# Patient Record
Sex: Female | Born: 1935 | Race: White | Hispanic: No | Marital: Single
Health system: Midwestern US, Academic
[De-identification: ages and names within clinical notes are randomized; demographics above are authoritative.]

---

## 2017-12-29 ENCOUNTER — Encounter: Admit: 2017-12-29 | Discharge: 2017-12-29 | Payer: MEDICARE

## 2017-12-29 DIAGNOSIS — R32 Unspecified urinary incontinence: ICD-10-CM

## 2017-12-29 DIAGNOSIS — R351 Nocturia: Principal | ICD-10-CM

## 2017-12-29 DIAGNOSIS — C50919 Malignant neoplasm of unspecified site of unspecified female breast: Principal | ICD-10-CM

## 2017-12-29 DIAGNOSIS — F329 Major depressive disorder, single episode, unspecified: ICD-10-CM

## 2017-12-29 DIAGNOSIS — R350 Frequency of micturition: ICD-10-CM

## 2017-12-29 DIAGNOSIS — E785 Hyperlipidemia, unspecified: ICD-10-CM

## 2017-12-29 DIAGNOSIS — N3941 Urge incontinence: ICD-10-CM

## 2017-12-29 DIAGNOSIS — K5792 Diverticulitis of intestine, part unspecified, without perforation or abscess without bleeding: ICD-10-CM

## 2017-12-29 DIAGNOSIS — N393 Stress incontinence (female) (male): ICD-10-CM

## 2017-12-29 DIAGNOSIS — N390 Urinary tract infection, site not specified: ICD-10-CM

## 2018-01-05 ENCOUNTER — Encounter: Admit: 2018-01-05 | Discharge: 2018-01-05 | Payer: MEDICARE

## 2018-01-05 DIAGNOSIS — N898 Other specified noninflammatory disorders of vagina: Principal | ICD-10-CM

## 2018-02-02 ENCOUNTER — Encounter: Admit: 2018-02-02 | Discharge: 2018-02-02 | Payer: MEDICARE

## 2018-02-02 ENCOUNTER — Ambulatory Visit: Admit: 2018-02-02 | Discharge: 2018-02-03 | Payer: MEDICARE

## 2018-02-02 DIAGNOSIS — R32 Unspecified urinary incontinence: ICD-10-CM

## 2018-02-02 DIAGNOSIS — N898 Other specified noninflammatory disorders of vagina: ICD-10-CM

## 2018-02-02 DIAGNOSIS — N952 Postmenopausal atrophic vaginitis: ICD-10-CM

## 2018-02-02 DIAGNOSIS — C50919 Malignant neoplasm of unspecified site of unspecified female breast: Principal | ICD-10-CM

## 2018-02-02 DIAGNOSIS — F329 Major depressive disorder, single episode, unspecified: ICD-10-CM

## 2018-02-02 DIAGNOSIS — N3946 Mixed incontinence: Principal | ICD-10-CM

## 2018-02-02 DIAGNOSIS — E785 Hyperlipidemia, unspecified: ICD-10-CM

## 2018-02-02 DIAGNOSIS — K5792 Diverticulitis of intestine, part unspecified, without perforation or abscess without bleeding: ICD-10-CM

## 2018-02-02 DIAGNOSIS — N390 Urinary tract infection, site not specified: ICD-10-CM

## 2018-02-02 MED ORDER — ESTRADIOL 0.01 % (0.1 MG/GRAM) VA CREA
VAGINAL | 1 refills | 30.00000 days | Status: AC
Start: 2018-02-02 — End: 2020-02-25

## 2018-02-02 MED ORDER — SODIUM CHLORIDE 0.9 % IJ SOLN
50 mL | Freq: Once | INTRAVENOUS | 0 refills | Status: AC
Start: 2018-02-02 — End: ?

## 2018-02-02 MED ORDER — GADOBENATE DIMEGLUMINE 529 MG/ML (0.1MMOL/0.2ML) IV SOLN
14 mL | Freq: Once | INTRAVENOUS | 0 refills | Status: CP
Start: 2018-02-02 — End: ?
  Administered 2018-02-02: 14:00:00 14 mL via INTRAVENOUS

## 2018-02-02 MED ORDER — GENTAMICIN 40 MG/ML IJ SOLN
80 mg | Freq: Once | INTRAMUSCULAR | 0 refills | Status: CP
Start: 2018-02-02 — End: ?
  Administered 2018-02-02: 16:00:00 80 mg via INTRAMUSCULAR

## 2018-02-02 MED ADMIN — WATER FOR INJECTION, STERILE IV SOLP [28400]: BODY_CAVITY | @ 16:00:00 | Stop: 2018-02-02 | NDC 00264785000

## 2018-02-02 MED ADMIN — LIDOCAINE HCL 2 % MM JELP [170085]: BODY_CAVITY | @ 16:00:00 | Stop: 2018-02-02 | NDC 76329301505

## 2018-02-05 ENCOUNTER — Encounter: Admit: 2018-02-05 | Discharge: 2018-02-05 | Payer: MEDICARE

## 2018-02-05 DIAGNOSIS — N898 Other specified noninflammatory disorders of vagina: Principal | ICD-10-CM

## 2018-05-04 ENCOUNTER — Encounter: Admit: 2018-05-04 | Discharge: 2018-05-04 | Payer: MEDICARE

## 2018-05-04 DIAGNOSIS — K5792 Diverticulitis of intestine, part unspecified, without perforation or abscess without bleeding: ICD-10-CM

## 2018-05-04 DIAGNOSIS — F329 Major depressive disorder, single episode, unspecified: ICD-10-CM

## 2018-05-04 DIAGNOSIS — R32 Unspecified urinary incontinence: ICD-10-CM

## 2018-05-04 DIAGNOSIS — N393 Stress incontinence (female) (male): ICD-10-CM

## 2018-05-04 DIAGNOSIS — R350 Frequency of micturition: Principal | ICD-10-CM

## 2018-05-04 DIAGNOSIS — N390 Urinary tract infection, site not specified: ICD-10-CM

## 2018-05-04 DIAGNOSIS — C50919 Malignant neoplasm of unspecified site of unspecified female breast: Principal | ICD-10-CM

## 2018-05-04 DIAGNOSIS — E785 Hyperlipidemia, unspecified: ICD-10-CM

## 2018-06-26 ENCOUNTER — Encounter: Admit: 2018-06-26 | Discharge: 2018-06-26 | Payer: MEDICARE

## 2018-06-26 DIAGNOSIS — N393 Stress incontinence (female) (male): Principal | ICD-10-CM

## 2018-06-26 DIAGNOSIS — R32 Unspecified urinary incontinence: Principal | ICD-10-CM

## 2018-07-04 DIAGNOSIS — N393 Stress incontinence (female) (male): ICD-10-CM

## 2018-07-06 ENCOUNTER — Ambulatory Visit: Admit: 2018-07-06 | Discharge: 2018-07-06 | Payer: MEDICARE

## 2018-07-06 ENCOUNTER — Encounter: Admit: 2018-07-06 | Discharge: 2018-07-06 | Payer: MEDICARE

## 2018-07-06 DIAGNOSIS — F329 Major depressive disorder, single episode, unspecified: ICD-10-CM

## 2018-07-06 DIAGNOSIS — N390 Urinary tract infection, site not specified: ICD-10-CM

## 2018-07-06 DIAGNOSIS — R32 Unspecified urinary incontinence: ICD-10-CM

## 2018-07-06 DIAGNOSIS — K5792 Diverticulitis of intestine, part unspecified, without perforation or abscess without bleeding: ICD-10-CM

## 2018-07-06 DIAGNOSIS — E785 Hyperlipidemia, unspecified: ICD-10-CM

## 2018-07-06 DIAGNOSIS — C50919 Malignant neoplasm of unspecified site of unspecified female breast: Principal | ICD-10-CM

## 2018-07-08 LAB — CULTURE-URINE W/SENSITIVITY
Lab: >10
Lab: >10 — AB

## 2018-07-09 ENCOUNTER — Encounter: Admit: 2018-07-09 | Discharge: 2018-07-09 | Payer: MEDICARE

## 2018-07-09 MED ORDER — SULFAMETHOXAZOLE-TRIMETHOPRIM 800-160 MG PO TAB
1 | ORAL_TABLET | Freq: Two times a day (BID) | ORAL | 0 refills | Status: AC
Start: 2018-07-09 — End: ?

## 2018-07-15 ENCOUNTER — Ambulatory Visit: Admit: 2018-07-15 | Discharge: 2018-07-15 | Payer: MEDICARE

## 2018-07-16 ENCOUNTER — Encounter: Admit: 2018-07-16 | Discharge: 2018-07-16 | Payer: MEDICARE

## 2018-07-16 ENCOUNTER — Ambulatory Visit: Admit: 2018-07-16 | Discharge: 2018-07-16 | Payer: MEDICARE

## 2018-07-16 DIAGNOSIS — K5792 Diverticulitis of intestine, part unspecified, without perforation or abscess without bleeding: ICD-10-CM

## 2018-07-16 DIAGNOSIS — Z881 Allergy status to other antibiotic agents status: ICD-10-CM

## 2018-07-16 DIAGNOSIS — R32 Unspecified urinary incontinence: ICD-10-CM

## 2018-07-16 DIAGNOSIS — Z7982 Long term (current) use of aspirin: ICD-10-CM

## 2018-07-16 DIAGNOSIS — Z833 Family history of diabetes mellitus: ICD-10-CM

## 2018-07-16 DIAGNOSIS — F329 Major depressive disorder, single episode, unspecified: ICD-10-CM

## 2018-07-16 DIAGNOSIS — N3946 Mixed incontinence: Principal | ICD-10-CM

## 2018-07-16 DIAGNOSIS — Z809 Family history of malignant neoplasm, unspecified: ICD-10-CM

## 2018-07-16 DIAGNOSIS — Z8249 Family history of ischemic heart disease and other diseases of the circulatory system: ICD-10-CM

## 2018-07-16 DIAGNOSIS — N390 Urinary tract infection, site not specified: ICD-10-CM

## 2018-07-16 DIAGNOSIS — E785 Hyperlipidemia, unspecified: ICD-10-CM

## 2018-07-16 DIAGNOSIS — Z87440 Personal history of urinary (tract) infections: ICD-10-CM

## 2018-07-16 DIAGNOSIS — Z853 Personal history of malignant neoplasm of breast: ICD-10-CM

## 2018-07-16 DIAGNOSIS — Z79899 Other long term (current) drug therapy: ICD-10-CM

## 2018-07-16 DIAGNOSIS — C50919 Malignant neoplasm of unspecified site of unspecified female breast: Principal | ICD-10-CM

## 2018-07-16 DIAGNOSIS — Z880 Allergy status to penicillin: ICD-10-CM

## 2018-07-16 LAB — BASIC METABOLIC PANEL
Lab: 1.4 mg/dL — ABNORMAL HIGH (ref 0.4–1.00)
Lab: 103 MMOL/L (ref 98–110)
Lab: 11 (ref 3–12)
Lab: 137 MMOL/L (ref 137–147)
Lab: 22 mg/dL (ref 7–25)
Lab: 23 MMOL/L (ref 21–30)
Lab: 3.9 MMOL/L (ref 3.5–5.1)
Lab: 34 mL/min — ABNORMAL LOW (ref >60)
Lab: 42 mL/min — ABNORMAL LOW (ref >60)
Lab: 9.5 mg/dL (ref 8.5–10.6)
Lab: 98 mg/dL (ref 70–100)

## 2018-07-16 MED ORDER — HYDROMORPHONE (PF) 2 MG/ML IJ SYRG
.25 mg | INTRAVENOUS | 0 refills | Status: DC | PRN
Start: 2018-07-16 — End: 2018-07-16

## 2018-07-16 MED ORDER — FENTANYL CITRATE (PF) 50 MCG/ML IJ SOLN
50 ug | INTRAVENOUS | 0 refills | Status: DC | PRN
Start: 2018-07-16 — End: 2018-07-16
  Administered 2018-07-16: 20:00:00 25 ug via INTRAVENOUS

## 2018-07-16 MED ORDER — SODIUM CHLORIDE 0.9 % IV SOLP
INTRAVENOUS | 0 refills | Status: DC
Start: 2018-07-16 — End: 2018-07-16
  Administered 2018-07-16: 18:00:00 1000.000 mL via INTRAVENOUS

## 2018-07-16 MED ORDER — GENTAMICIN IVPB (EXTENDED INTERVAL)
330 mg | Freq: Once | INTRAVENOUS | 0 refills | Status: CP
Start: 2018-07-16 — End: ?
  Administered 2018-07-16 (×2): 330 mg via INTRAVENOUS

## 2018-07-16 MED ORDER — DEXAMETHASONE SODIUM PHOSPHATE 4 MG/ML IJ SOLN
INTRAVENOUS | 0 refills | Status: DC
Start: 2018-07-16 — End: 2018-07-16
  Administered 2018-07-16: 19:00:00 4 mg via INTRAVENOUS

## 2018-07-16 MED ORDER — SODIUM CHLORIDE 0.9% IRRIGATION BAG
0 refills | Status: DC
Start: 2018-07-16 — End: 2018-07-16
  Administered 2018-07-16: 19:00:00 3000 mL

## 2018-07-16 MED ORDER — PROMETHAZINE 25 MG/ML IJ SOLN
6.25 mg | INTRAVENOUS | 0 refills | Status: DC | PRN
Start: 2018-07-16 — End: 2018-07-16

## 2018-07-16 MED ORDER — PROPOFOL INJ 10 MG/ML IV VIAL
0 refills | Status: DC
Start: 2018-07-16 — End: 2018-07-16
  Administered 2018-07-16: 19:00:00 80 mg via INTRAVENOUS
  Administered 2018-07-16: 19:00:00 20 mg via INTRAVENOUS

## 2018-07-16 MED ORDER — DEXTRAN 70-HYPROMELLOSE (PF) 0.1-0.3 % OP DPET
0 refills | Status: DC
Start: 2018-07-16 — End: 2018-07-16
  Administered 2018-07-16: 19:00:00 2 [drp] via OPHTHALMIC

## 2018-07-16 MED ORDER — ONDANSETRON HCL (PF) 4 MG/2 ML IJ SOLN
INTRAVENOUS | 0 refills | Status: DC
Start: 2018-07-16 — End: 2018-07-16
  Administered 2018-07-16: 19:00:00 4 mg via INTRAVENOUS

## 2018-07-16 MED ORDER — LACTATED RINGERS IV SOLP
INTRAVENOUS | 0 refills | Status: DC
Start: 2018-07-16 — End: 2018-07-16

## 2018-07-16 MED ORDER — LIDOCAINE (PF) 200 MG/10 ML (2 %) IJ SYRG
0 refills | Status: DC
Start: 2018-07-16 — End: 2018-07-16
  Administered 2018-07-16: 19:00:00 60 mg via INTRAVENOUS

## 2018-07-16 MED ORDER — EPHEDRINE SULFATE 50 MG/5ML SYR (10 MG/ML) (AN)(OSM)
0 refills | Status: DC
Start: 2018-07-16 — End: 2018-07-16
  Administered 2018-07-16 (×2): 5 mg via INTRAVENOUS

## 2018-07-16 MED ORDER — HALOPERIDOL LACTATE 5 MG/ML IJ SOLN
1 mg | Freq: Once | INTRAVENOUS | 0 refills | Status: DC | PRN
Start: 2018-07-16 — End: 2018-07-16

## 2018-07-16 MED ORDER — FENTANYL CITRATE (PF) 50 MCG/ML IJ SOLN
0 refills | Status: DC
Start: 2018-07-16 — End: 2018-07-16
  Administered 2018-07-16: 19:00:00 25 ug via INTRAVENOUS

## 2018-07-16 MED ORDER — MEPERIDINE (PF) 25 MG/ML IJ SYRG
12.5 mg | INTRAVENOUS | 0 refills | Status: DC | PRN
Start: 2018-07-16 — End: 2018-07-16

## 2018-07-16 MED ORDER — LIDOCAINE (PF) 10 MG/ML (1 %) IJ SOLN
.1-2 mL | INTRAMUSCULAR | 0 refills | Status: DC | PRN
Start: 2018-07-16 — End: 2018-07-16

## 2018-07-16 MED ORDER — OXYCODONE 5 MG PO TAB
5 mg | Freq: Once | ORAL | 0 refills | Status: CP | PRN
Start: 2018-07-16 — End: ?
  Administered 2018-07-16: 20:00:00 5 mg via ORAL

## 2018-07-16 MED ORDER — ACETAMINOPHEN 650 MG PO TBER
650 mg | ORAL_TABLET | ORAL | 0 refills | Status: AC | PRN
Start: 2018-07-16 — End: ?

## 2018-07-18 ENCOUNTER — Encounter: Admit: 2018-07-18 | Discharge: 2018-07-18 | Payer: MEDICARE

## 2018-07-18 DIAGNOSIS — E785 Hyperlipidemia, unspecified: ICD-10-CM

## 2018-07-18 DIAGNOSIS — R32 Unspecified urinary incontinence: ICD-10-CM

## 2018-07-18 DIAGNOSIS — F329 Major depressive disorder, single episode, unspecified: ICD-10-CM

## 2018-07-18 DIAGNOSIS — C50919 Malignant neoplasm of unspecified site of unspecified female breast: Principal | ICD-10-CM

## 2018-07-18 DIAGNOSIS — N390 Urinary tract infection, site not specified: ICD-10-CM

## 2018-07-18 DIAGNOSIS — K5792 Diverticulitis of intestine, part unspecified, without perforation or abscess without bleeding: ICD-10-CM

## 2018-09-14 ENCOUNTER — Encounter: Admit: 2018-09-14 | Discharge: 2018-09-14 | Payer: MEDICARE

## 2018-09-14 DIAGNOSIS — N390 Urinary tract infection, site not specified: ICD-10-CM

## 2018-09-14 DIAGNOSIS — F329 Major depressive disorder, single episode, unspecified: ICD-10-CM

## 2018-09-14 DIAGNOSIS — R32 Unspecified urinary incontinence: ICD-10-CM

## 2018-09-14 DIAGNOSIS — E785 Hyperlipidemia, unspecified: ICD-10-CM

## 2018-09-14 DIAGNOSIS — K5792 Diverticulitis of intestine, part unspecified, without perforation or abscess without bleeding: ICD-10-CM

## 2018-09-14 DIAGNOSIS — N3946 Mixed incontinence: Principal | ICD-10-CM

## 2018-09-14 DIAGNOSIS — C50919 Malignant neoplasm of unspecified site of unspecified female breast: Principal | ICD-10-CM

## 2018-10-06 IMAGING — US ECHOCOMPL
1 series · 14 of 24 positions shown · non-contrast
Comparison: none

[Series 1: us echo 2d, wo/w m-mode, compl · 124 acquisitions, 14 frames shown]
[im 1/124]
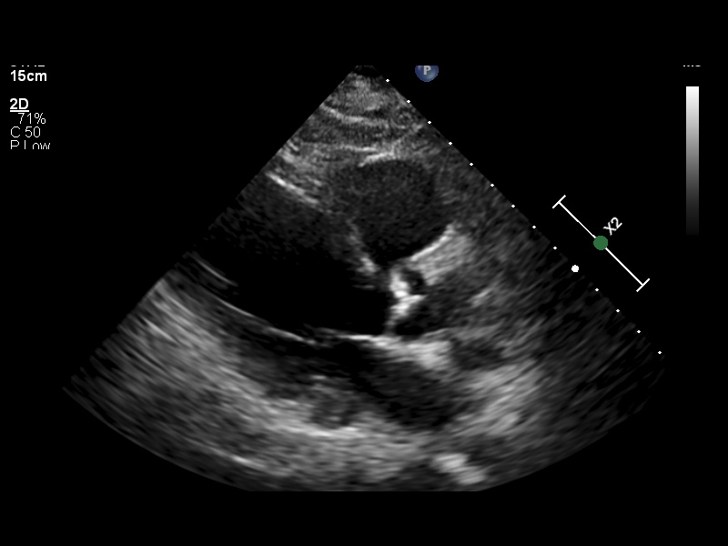
[im 11/124]
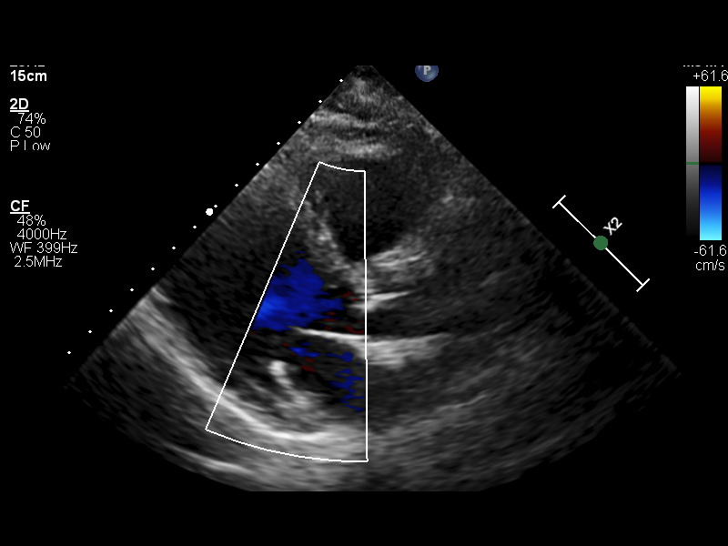
[im 17/124]
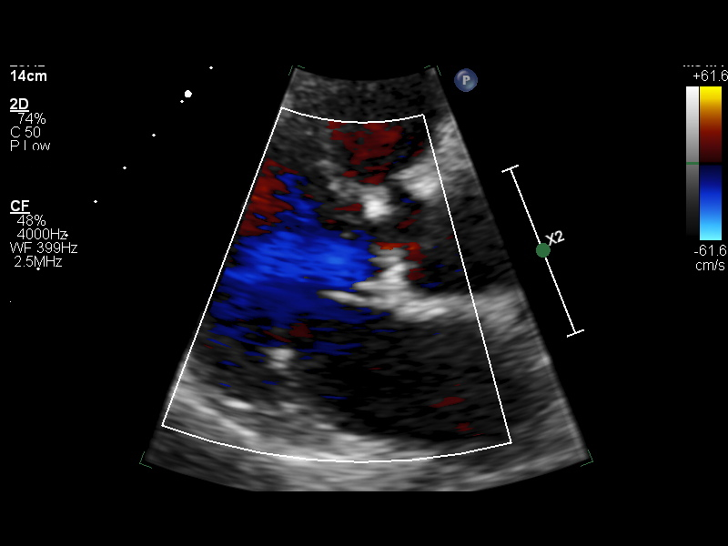
[im 33/124]
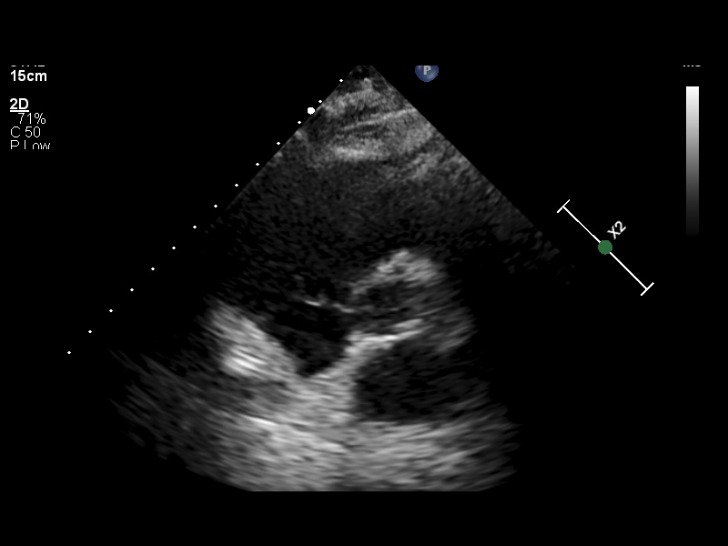
[im 33/124]
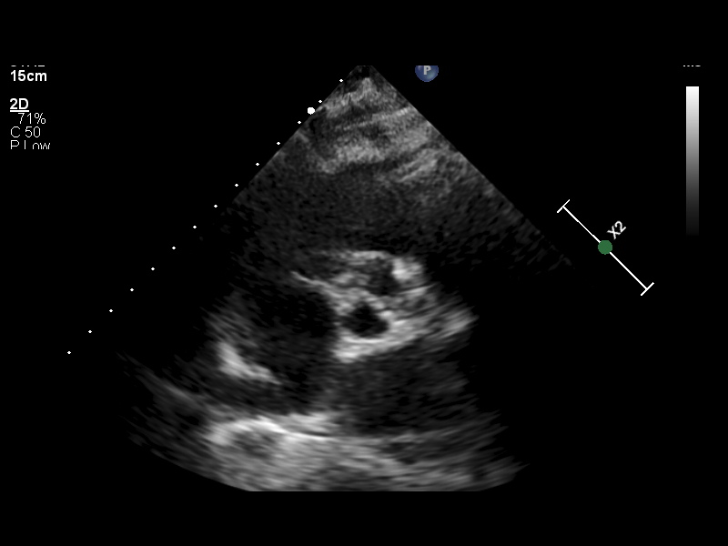
[im 43/124]
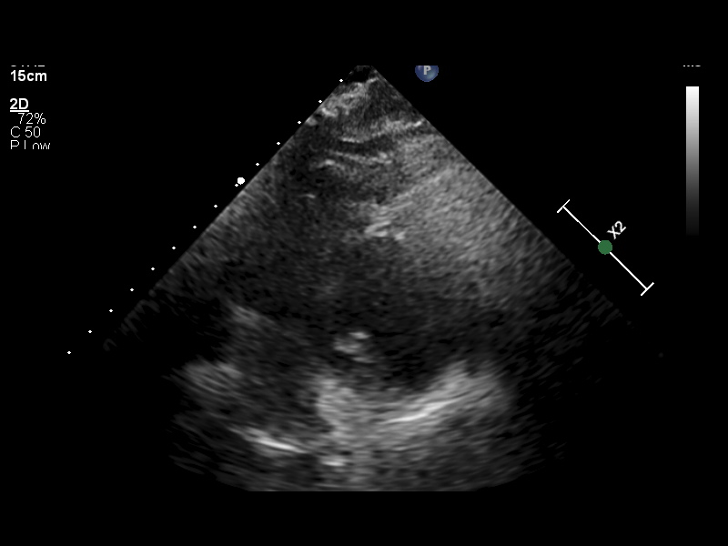
[im 59/124]
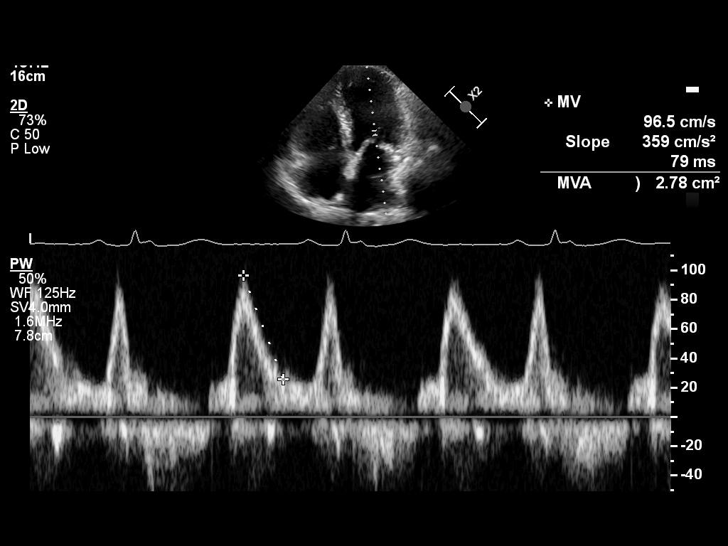
[im 65/124]
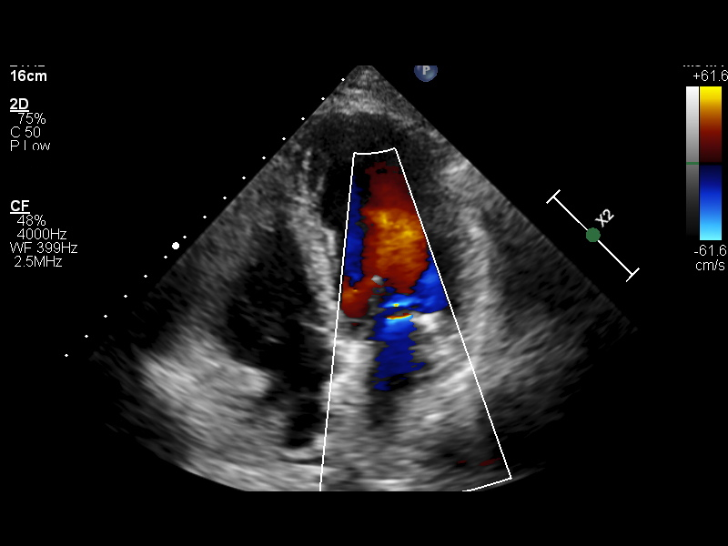
[im 75/124]
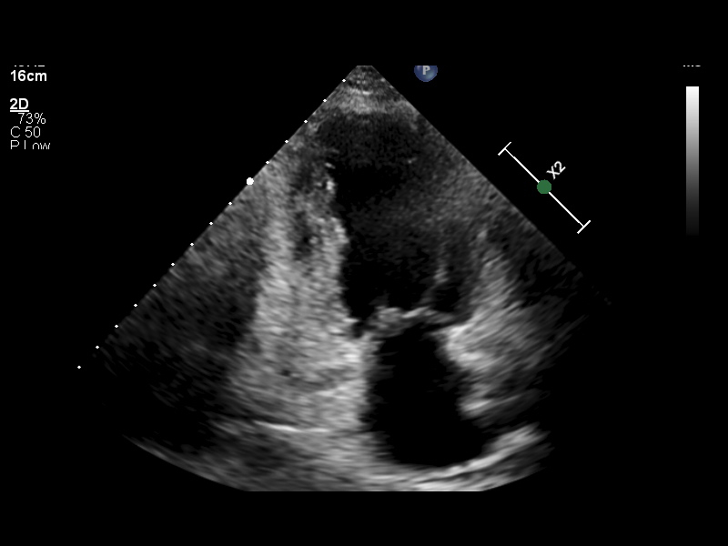
[im 86/124]
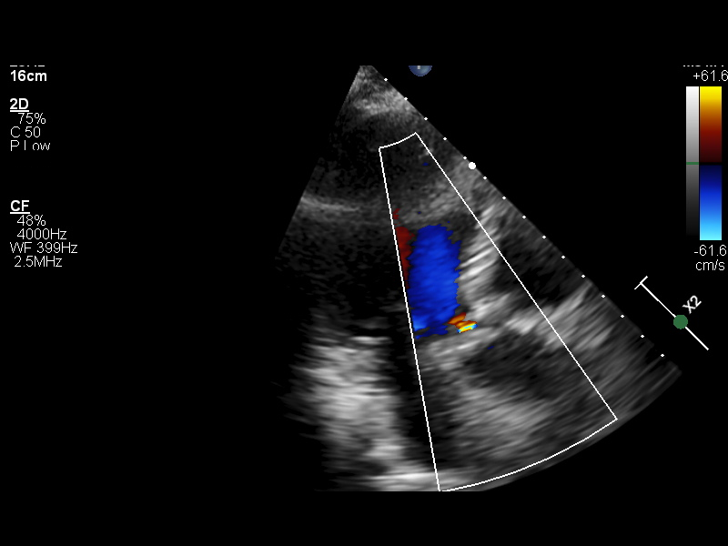
[im 97/124]
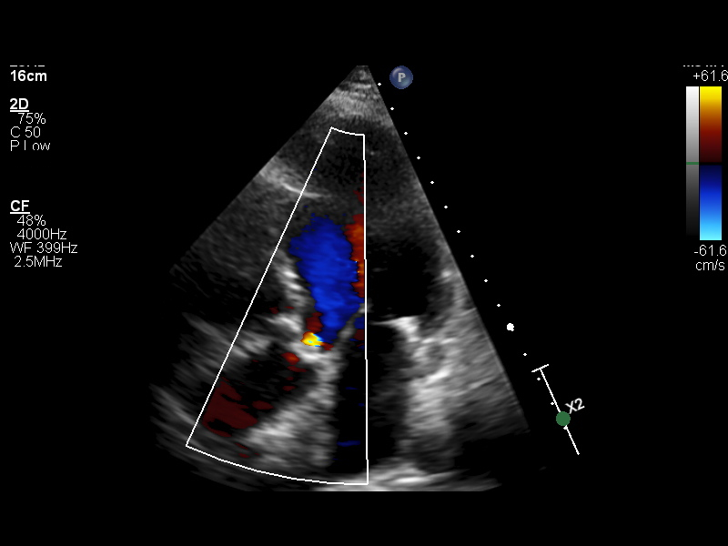
[im 102/124]
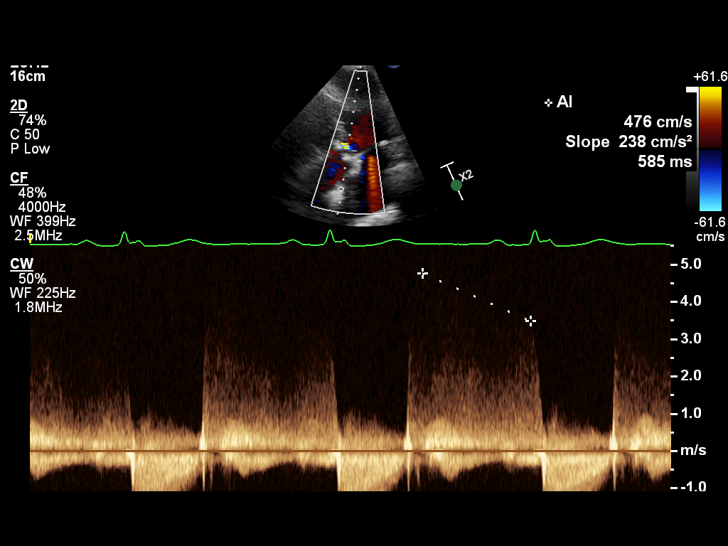
[im 113/124]
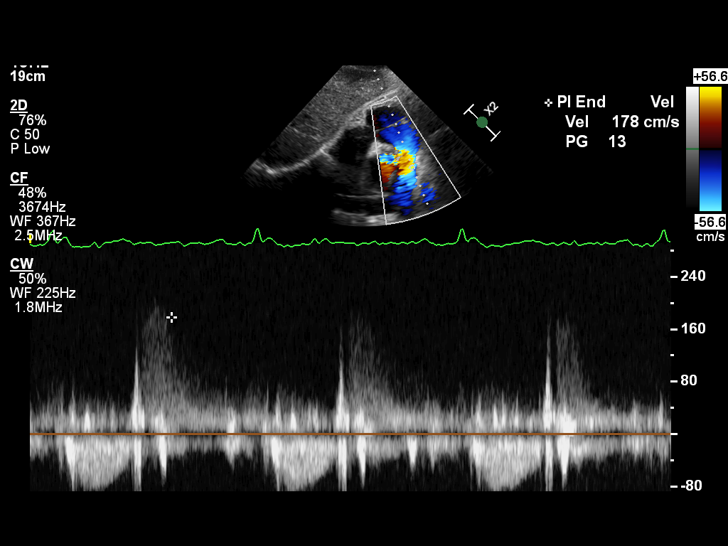
[im 124/124]
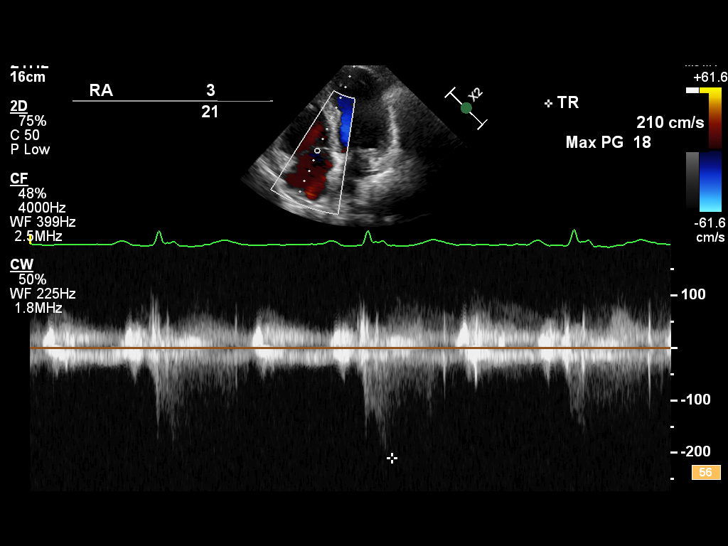

[14 of 24 positions shown; findings below may reference images not displayed]

FINAL REPORT IS SCANNED IN THE PATIENT'S EMR.

Tech Notes:

jl

## 2018-11-30 ENCOUNTER — Encounter: Admit: 2018-11-30 | Discharge: 2018-11-30 | Payer: MEDICARE

## 2018-11-30 MED ORDER — TROSPIUM 60 MG PO CP24
1 | ORAL_CAPSULE | Freq: Every day | ORAL | 3 refills | 30.00000 days | Status: AC
Start: 2018-11-30 — End: 2018-12-06

## 2018-12-04 ENCOUNTER — Encounter: Admit: 2018-12-04 | Discharge: 2018-12-04 | Payer: MEDICARE

## 2018-12-06 MED ORDER — OXYBUTYNIN CHLORIDE 10 MG PO TR24
ORAL_TABLET | 0 refills | 12.00000 days | Status: AC
Start: 2018-12-06 — End: 2018-12-07

## 2018-12-07 MED ORDER — OXYBUTYNIN CHLORIDE 10 MG PO TR24
10 mg | ORAL_TABLET | Freq: Every day | ORAL | 1 refills | 12.00000 days | Status: AC
Start: 2018-12-07 — End: 2019-05-17

## 2018-12-11 ENCOUNTER — Encounter: Admit: 2018-12-11 | Discharge: 2018-12-11 | Payer: MEDICARE

## 2018-12-11 MED ORDER — MYRBETRIQ 25 MG PO TB24
25 mg | ORAL_TABLET | Freq: Every day | ORAL | 3 refills | Status: AC
Start: 2018-12-11 — End: 2019-05-10

## 2018-12-30 ENCOUNTER — Encounter: Admit: 2018-12-30 | Discharge: 2018-12-30 | Payer: MEDICARE

## 2019-01-14 ENCOUNTER — Encounter: Admit: 2019-01-14 | Discharge: 2019-01-14 | Payer: MEDICARE

## 2019-04-29 ENCOUNTER — Encounter: Admit: 2019-04-29 | Discharge: 2019-04-29

## 2019-05-08 ENCOUNTER — Encounter: Admit: 2019-05-08 | Discharge: 2019-05-08

## 2019-05-10 MED ORDER — MYRBETRIQ 25 MG PO TB24
ORAL_TABLET | Freq: Every day | 3 refills | Status: DC
Start: 2019-05-10 — End: 2019-06-14

## 2019-05-17 ENCOUNTER — Encounter: Admit: 2019-05-17 | Discharge: 2019-05-17

## 2019-05-17 DIAGNOSIS — E785 Hyperlipidemia, unspecified: Secondary | ICD-10-CM

## 2019-05-17 DIAGNOSIS — N390 Urinary tract infection, site not specified: Secondary | ICD-10-CM

## 2019-05-17 DIAGNOSIS — N3941 Urge incontinence: Secondary | ICD-10-CM

## 2019-05-17 DIAGNOSIS — F329 Major depressive disorder, single episode, unspecified: Secondary | ICD-10-CM

## 2019-05-17 DIAGNOSIS — C50919 Malignant neoplasm of unspecified site of unspecified female breast: Secondary | ICD-10-CM

## 2019-05-17 DIAGNOSIS — K5792 Diverticulitis of intestine, part unspecified, without perforation or abscess without bleeding: Secondary | ICD-10-CM

## 2019-05-17 DIAGNOSIS — R351 Nocturia: Secondary | ICD-10-CM

## 2019-05-17 DIAGNOSIS — R3915 Urgency of urination: Principal | ICD-10-CM

## 2019-05-17 DIAGNOSIS — N393 Stress incontinence (female) (male): Secondary | ICD-10-CM

## 2019-05-17 DIAGNOSIS — R32 Unspecified urinary incontinence: Secondary | ICD-10-CM

## 2019-05-17 NOTE — Progress Notes
Urology    CC:  Follow Up      HPI:  Tina Casey is a 85 y.o. female with PMH significant forfor breast cancer, s/p hysterectomy??? who is followed at [facility] for mixed urinary incontinence.    Cystoscopy- cystitis cystica, otherwise normal  Urodynamic diagnoses:  Stress urinary incontinence???with???Intrinsic sphincter deficiency,???vlpp of 57 cm H2O  Complete bladder emptying  ???  Plan: PFPT, TVE  ???  07/16/18- urethral bulking with macroplastique. She had about an 80-90% improvement in UI.     Today, 05/17/2019, she has been taking mirabegron 25mg  daily. She had tried oxybutynin but it made her symptoms worse. This was during covid so she did not come in for a PVR check. She continues to have symptoms of urgency incontinence and stress incontinence. She is primarily bothered by urgency incontinence at this point. Nocturia x1-2 with nocturnal enuresis.  She has not been using estrace.    PMH:  Medical History:   Diagnosis Date   ??? Breast cancer (HCC)    ??? Depression    ??? Diverticulitis    ??? Dyslipidemia    ??? Incontinence    ??? UTI (urinary tract infection)         PSH:  Surgical History:   Procedure Laterality Date   ??? ENDOSCOPIC INJECTION IMPLANT MATERIAL INTO URETHRA/ BLADDER NECK (MACROPLASTIQUE) N/A 07/16/2018    Performed by Venetia Maxon, MD at [facility] OR   ??? CATARACT REMOVAL     ??? HX APPENDECTOMY     ??? HX CHOLECYSTECTOMY     ??? HX HYSTERECTOMY     ??? JOINT REPLACEMENT     ??? MASTECTOMY     ??? TONSILLECTOMY         Medications:  Current Outpatient Medications   Medication Sig Dispense Refill   ??? acetaminophen SR(+) (TYLENOL) 650 mg tablet Take one tablet by mouth every 6 hours as needed for Pain. 30 tablet    ??? aspirin EC 81 mg tablet Take 81 mg by mouth daily. Take with food.     ??? atorvastatin (LIPITOR) 20 mg tablet Take 20 mg by mouth daily.  3   ??? CALCIUM PO Take 600 mg by mouth daily.     ??? cholecalciferol (VITAMIN D-3) 1,000 units tablet Take 1,000 Units by mouth daily. ??? cranberry fruit extract (CRANBERRY PO) Take  by mouth.     ??? estradiol (ESTRACE) 0.01 % (0.1 mg/g) vaginal cream Insert or Apply  to vaginal area three times weekly. Insert pea-sized amount into vagina 3 nights a week. (Patient not taking: Reported on 07/06/2018) 42.5 g 1   ??? fluticasone propionate (FLONASE NA) Apply 2 sprays into nose as directed daily.     ??? MYRBETRIQ 25 mg tablet TAKE 1 TABLET BY MOUTH EVERY DAY 30 tablet 3   ??? Omega-3-DHA-EPA-Fish Oil (FISH OIL) 1,000 mg (120 mg-180 mg) cap Take  by mouth daily.     ??? pantoprazole DR (PROTONIX) 40 mg tablet Take 40 mg by mouth daily.  3   ??? sertraline (ZOLOFT) 50 mg tablet Take 50 mg by mouth daily.  3   ??? tamoxifen (NOLVADEX) 20 mg tablet Take 20 mg by mouth daily.  3       Allergies:  Ciprofloxacin and Penicillins    Social History:  Social History     Substance and Sexual Activity   Alcohol Use Yes   ??? Alcohol/week: 1.0 standard drinks   ??? Types: 1 Shots of liquor per week  Social History     Tobacco Use   Smoking Status Never Smoker   Smokeless Tobacco Never Used      Social History     Substance and Sexual Activity   Drug Use Never       Family History:  Family History   Problem Relation Age of Onset   ??? Cancer Other    ??? Diabetes Other    ??? Heart Disease Other    ??? Stroke Other        Review of Systems:    Review of Systems   Constitutional: Negative for activity change, appetite change, chills, diaphoresis, fatigue, fever and unexpected weight change.   HENT: Negative for congestion, hearing loss, mouth sores and sinus pressure.    Eyes: Negative for visual disturbance.   Respiratory: Negative for apnea, cough, chest tightness and shortness of breath.    Cardiovascular: Negative for chest pain, palpitations and leg swelling.   Gastrointestinal: Negative for abdominal pain, blood in stool, constipation, diarrhea, nausea, rectal pain and vomiting.   Genitourinary:        See HPI   Musculoskeletal: Negative for arthralgias, back pain, gait problem and myalgias.   Skin: Negative for rash and wound.   Allergic/Immunologic: Positive for environmental allergies.   Neurological: Negative for dizziness, tremors, seizures, syncope, weakness, light-headedness, numbness and headaches.   Hematological: Negative for adenopathy. Does not bruise/bleed easily.   Psychiatric/Behavioral: Negative for decreased concentration and dysphoric mood. The patient is not nervous/anxious.        Physical Exam:  Vitals:    05/17/19 1031   BP: (!) 181/71   Pulse: 72   Resp: 16   Temp: 36.1 ???C (97 ???F)   SpO2: 98%       Recheck on blood pressure was 153/79    Gen: well-developed, NAD  HEENT: normocephalic, conjuctiva clear, mucous membranes moist  Respiratory: normal respiratory effort with symmetric chest expansion  Skin: Warm, No jaundice  Musculoskeletal: No edema, no gross deformity  Neuro: Awake, alert, no focal deficits, normal gait  Psychiatric: Normal affect  GU:   normal appearing external genitalia, urethral meatus normal in location, no urethral mucosal prolapse  Vaginal epithelium severely atrophic with discoloration of anterior vaginal wall, no vaginal discharge, no foreign bodies visible or palpable, no prolapse, neg supine cough stress test (PVR of 61mL at the time)    Diagnostics:    Bladder scan PVR: 61 mL    Labs:   Creatinine   Date Value Ref Range Status   07/16/2018 1.45 (H) 0.4 - 1.00 MG/DL Final     Culture   Date Value Ref Range Status   07/06/2018 >100,000 organisms/ml  ESCHERICHIA COLI   (A)  Final       Assessment/Plan:    86yF with MUI s/p urethral bulking Oct 2019 now with predominantly urgency incontinence symptoms    She will resume using estrace cream.  I would like to re-examine her at her 6th PTNS to ensure vaginal exam is improving  Cont mirabegron  We reviewed all treatment options for urgency incontinence and she would like to try PTNS. She was also interested in sacral neuromodulation but would like to avoid surgery if possible. 1. Urinary urgency     2. Nocturia     3. Urge incontinence of urine     4. Stress incontinence         Return for schedule for 6 visits of PTNS- she would like 10am appointment times.  Venetia Maxon, MD

## 2019-05-23 ENCOUNTER — Encounter: Admit: 2019-05-23 | Discharge: 2019-05-23

## 2019-05-24 ENCOUNTER — Encounter: Admit: 2019-05-24 | Discharge: 2019-05-24

## 2019-05-24 DIAGNOSIS — R32 Unspecified urinary incontinence: Secondary | ICD-10-CM

## 2019-05-24 DIAGNOSIS — C50919 Malignant neoplasm of unspecified site of unspecified female breast: Secondary | ICD-10-CM

## 2019-05-24 DIAGNOSIS — R3915 Urgency of urination: Principal | ICD-10-CM

## 2019-05-24 DIAGNOSIS — K5792 Diverticulitis of intestine, part unspecified, without perforation or abscess without bleeding: Secondary | ICD-10-CM

## 2019-05-24 DIAGNOSIS — F329 Major depressive disorder, single episode, unspecified: Secondary | ICD-10-CM

## 2019-05-24 DIAGNOSIS — E785 Hyperlipidemia, unspecified: Secondary | ICD-10-CM

## 2019-05-24 DIAGNOSIS — N390 Urinary tract infection, site not specified: Secondary | ICD-10-CM

## 2019-05-24 NOTE — Progress Notes
Percutaneous Tibial Nerve Stimulation (PTNS)   Treatment number #1of 12     Treatment Administrator:Lashanda Richardson,MA    Percutaneous tibial nerve stimulation (PTNS) was prescribed forAnn M. Casey Overactive Bladder symptoms ofUrinary urgency. The needle electrode was inserted into the lower, inner aspect of the rightleg. The surface electrode was placed on the inside arch of the foot on the treatment leg. The lead set was connected to the stimulator, and the needle electrode clip was connected to the needle electrode. The stimulator that produces an adjustable electrical pulse that travels to the sacral nerve plexus via the tibial nerve was adjusted to a setting of5.The voiding diary or patients symptoms were reviewed prior to the start of treatment.      I was immediately available during the procedure.   Delton Coombes, MD

## 2019-05-31 ENCOUNTER — Encounter: Admit: 2019-05-31 | Discharge: 2019-05-31

## 2019-05-31 DIAGNOSIS — R3915 Urgency of urination: Secondary | ICD-10-CM

## 2019-05-31 DIAGNOSIS — N3941 Urge incontinence: Secondary | ICD-10-CM

## 2019-05-31 NOTE — Progress Notes
Percutaneous Tibial Nerve Stimulation (PTNS)   Treatment number #2of 12     Treatment Administrator:Lashanda Richardson,MA    Percutaneous tibial nerve stimulation (PTNS) was prescribed forAnn M. Casey Overactive Bladder symptoms ofUrinary urgency. The needle electrode was inserted into the lower, inner aspect of the leftleg. The surface electrode was placed on the inside arch of the foot on the treatment leg. The lead set was connected to the stimulator, and the needle electrode clip was connected to the needle electrode. The stimulator that produces an adjustable electrical pulse that travels to the sacral nerve plexus via the tibial nerve was adjusted to a setting of6.The voiding diary or patients symptoms were reviewed prior to the start of treatment.      I was immediately available during the procedure.  Delton Coombes, MD

## 2019-06-07 ENCOUNTER — Encounter: Admit: 2019-06-07 | Discharge: 2019-06-07

## 2019-06-07 DIAGNOSIS — R3915 Urgency of urination: Principal | ICD-10-CM

## 2019-06-07 NOTE — Progress Notes
Percutaneous Tibial Nerve Stimulation (PTNS)   Treatment number #3of 12     Treatment Administrator:Lashanda Richardson,MA    Percutaneous tibial nerve stimulation (PTNS) was prescribed forAnn M. Kernions Overactive Bladder symptoms ofUrinary urgency. The needle electrode was inserted into the lower, inner aspect of the Rightleg. The surface electrode was placed on the inside arch of the foot on the treatment leg. The lead set was connected to the stimulator, and the needle electrode clip was connected to the needle electrode. The stimulator that produces an adjustable electrical pulse that travels to the sacral nerve plexus via the tibial nerve was adjusted to a setting of5.The voiding diary or patients symptoms were reviewed prior to the start of treatment.      I was immediately available during the procedure.  Delton Coombes, MD

## 2019-06-13 ENCOUNTER — Encounter: Admit: 2019-06-13 | Discharge: 2019-06-13 | Payer: MEDICARE

## 2019-06-14 ENCOUNTER — Encounter: Admit: 2019-06-14 | Discharge: 2019-06-14 | Payer: MEDICARE

## 2019-06-14 NOTE — Progress Notes
Percutaneous Tibial Nerve Stimulation (PTNS)   Treatment number #4of 12     Treatment Administrator:Lashanda Richardson,MA    Percutaneous tibial nerve stimulation (PTNS) was prescribed forAnn M. Kernions Overactive Bladder symptoms ofUrinary urgency. The needle electrode was inserted into the lower, inner aspect of theRightleg. The surface electrode was placed on the inside arch of the foot on the treatment leg. The lead set was connected to the stimulator, and the needle electrode clip was connected to the needle electrode. The stimulator that produces an adjustable electrical pulse that travels to the sacral nerve plexus via the tibial nerve was adjusted to a setting of3.The voiding diary or patients symptoms were reviewed prior to the start of treatment.      I was immediately available during the procedure.  She is going to stop the mirabegron since her symptoms are improving with the PTNS and the mirabegron is $$.  Delton Coombes, MD

## 2019-06-24 MED ORDER — MYRBETRIQ 25 MG PO TB24
25 mg | ORAL_TABLET | Freq: Every day | ORAL | 3 refills | Status: AC
Start: 2019-06-24 — End: ?

## 2019-06-27 ENCOUNTER — Encounter: Admit: 2019-06-27 | Discharge: 2019-06-27 | Payer: MEDICARE

## 2019-06-28 ENCOUNTER — Encounter: Admit: 2019-06-28 | Discharge: 2019-06-28 | Payer: MEDICARE

## 2019-06-28 DIAGNOSIS — N390 Urinary tract infection, site not specified: Secondary | ICD-10-CM

## 2019-06-28 DIAGNOSIS — F329 Major depressive disorder, single episode, unspecified: Secondary | ICD-10-CM

## 2019-06-28 DIAGNOSIS — C50919 Malignant neoplasm of unspecified site of unspecified female breast: Secondary | ICD-10-CM

## 2019-06-28 DIAGNOSIS — R32 Unspecified urinary incontinence: Secondary | ICD-10-CM

## 2019-06-28 DIAGNOSIS — K5792 Diverticulitis of intestine, part unspecified, without perforation or abscess without bleeding: Secondary | ICD-10-CM

## 2019-06-28 DIAGNOSIS — E785 Hyperlipidemia, unspecified: Secondary | ICD-10-CM

## 2019-06-28 DIAGNOSIS — R3915 Urgency of urination: Secondary | ICD-10-CM

## 2019-06-28 NOTE — Progress Notes
Percutaneous Tibial Nerve Stimulation (PTNS)   Treatment number #5of 12     Treatment Administrator:Crystal Landry Mellow MA     Percutaneous tibial nerve stimulation (PTNS) was prescribed forAnn M. Kernions Overactive Bladder symptoms ofUrinary urgency. The needle electrode was inserted into the lower, inner aspect of theRightleg. The surface electrode was placed on the inside arch of the foot on the treatment leg. The lead set was connected to the stimulator, and the needle electrode clip was connected to the needle electrode. The stimulator that produces an adjustable electrical pulse that travels to the sacral nerve plexus via the tibial nerve was adjusted to a setting of5.The voiding diary or patients symptoms were reviewed prior to the start of treatment.      I was immediately available during the procedure.   Delton Coombes, MD

## 2019-07-12 ENCOUNTER — Encounter: Admit: 2019-07-12 | Discharge: 2019-07-12 | Payer: MEDICARE

## 2019-07-12 DIAGNOSIS — R32 Unspecified urinary incontinence: Secondary | ICD-10-CM

## 2019-07-12 DIAGNOSIS — E785 Hyperlipidemia, unspecified: Secondary | ICD-10-CM

## 2019-07-12 DIAGNOSIS — N390 Urinary tract infection, site not specified: Secondary | ICD-10-CM

## 2019-07-12 DIAGNOSIS — C50919 Malignant neoplasm of unspecified site of unspecified female breast: Secondary | ICD-10-CM

## 2019-07-12 DIAGNOSIS — K5792 Diverticulitis of intestine, part unspecified, without perforation or abscess without bleeding: Secondary | ICD-10-CM

## 2019-07-12 DIAGNOSIS — F329 Major depressive disorder, single episode, unspecified: Secondary | ICD-10-CM

## 2019-07-15 ENCOUNTER — Encounter: Admit: 2019-07-15 | Discharge: 2019-07-15 | Payer: MEDICARE

## 2019-07-19 ENCOUNTER — Encounter: Admit: 2019-07-19 | Discharge: 2019-07-19 | Payer: MEDICARE

## 2019-07-19 DIAGNOSIS — N3941 Urge incontinence: Secondary | ICD-10-CM

## 2019-07-19 DIAGNOSIS — R3915 Urgency of urination: Secondary | ICD-10-CM

## 2019-07-19 NOTE — Progress Notes
Percutaneous Tibial Nerve Stimulation (PTNS)   Treatment number #6of 12     Treatment Administrator:Lashanda Richardson MA     Percutaneous tibial nerve stimulation (PTNS) was prescribed forAnn M. Kernions Overactive Bladder symptoms ofUrinary urgency. The needle electrode was inserted into the lower, inner aspect of theRightleg. The surface electrode was placed on the inside arch of the foot on the treatment leg. The lead set was connected to the stimulator, and the needle electrode clip was connected to the needle electrode. The stimulator that produces an adjustable electrical pulse that travels to the sacral nerve plexus via the tibial nerve was adjusted to a setting of5.The voiding diary or patients symptoms were reviewed prior to the start of treatment.      I was immediately available during the procedure. She would like to continue with the remaining treatments as she feels that she has had improvement.  Delton Coombes, MD

## 2019-07-25 ENCOUNTER — Encounter: Admit: 2019-07-25 | Discharge: 2019-07-25 | Payer: MEDICARE

## 2019-07-26 ENCOUNTER — Encounter: Admit: 2019-07-26 | Discharge: 2019-07-26 | Payer: MEDICARE

## 2019-07-26 DIAGNOSIS — R3915 Urgency of urination: Secondary | ICD-10-CM

## 2019-07-26 DIAGNOSIS — N3941 Urge incontinence: Secondary | ICD-10-CM

## 2019-07-26 NOTE — Progress Notes
Percutaneous Tibial Nerve Stimulation (PTNS)   Treatment number #7of 12     Treatment Administrator:Lashanda Richardson MA    Percutaneous tibial nerve stimulation (PTNS) was prescribed forAnn M. Casey Overactive Bladder symptoms ofUrinary urgency. The needle electrode was inserted into the lower, inner aspect of theRightleg. The surface electrode was placed on the inside arch of the foot on the treatment leg. The lead set was connected to the stimulator, and the needle electrode clip was connected to the needle electrode. The stimulator that produces an adjustable electrical pulse that travels to the sacral nerve plexus via the tibial nerve was adjusted to a setting of3.The voiding diary or patients symptoms were reviewed prior to the start of treatment.      I was immediately available during the procedure.  Delton Coombes, MD

## 2019-08-01 ENCOUNTER — Encounter: Admit: 2019-08-01 | Discharge: 2019-08-01 | Payer: MEDICARE

## 2019-08-02 ENCOUNTER — Encounter: Admit: 2019-08-02 | Discharge: 2019-08-02 | Payer: MEDICARE

## 2019-08-02 DIAGNOSIS — K5792 Diverticulitis of intestine, part unspecified, without perforation or abscess without bleeding: Secondary | ICD-10-CM

## 2019-08-02 DIAGNOSIS — R32 Unspecified urinary incontinence: Secondary | ICD-10-CM

## 2019-08-02 DIAGNOSIS — E785 Hyperlipidemia, unspecified: Secondary | ICD-10-CM

## 2019-08-02 DIAGNOSIS — C50919 Malignant neoplasm of unspecified site of unspecified female breast: Secondary | ICD-10-CM

## 2019-08-02 DIAGNOSIS — F329 Major depressive disorder, single episode, unspecified: Secondary | ICD-10-CM

## 2019-08-02 DIAGNOSIS — N390 Urinary tract infection, site not specified: Secondary | ICD-10-CM

## 2019-08-02 DIAGNOSIS — R3915 Urgency of urination: Secondary | ICD-10-CM

## 2019-08-02 NOTE — Progress Notes
Percutaneous Tibial Nerve Stimulation (PTNS)   Treatment number #8of 12     Treatment Administrator:Lashanda RichardsonMA    Percutaneous tibial nerve stimulation (PTNS) was prescribed forAnn M. Kernions Overactive Bladder symptoms ofUrinary urgency. The needle electrode was inserted into the lower, inner aspect of theRightleg. The surface electrode was placed on the inside arch of the foot on the treatment leg. The lead set was connected to the stimulator, and the needle electrode clip was connected to the needle electrode. The stimulator that produces an adjustable electrical pulse that travels to the sacral nerve plexus via the tibial nerve was adjusted to a setting of6.The voiding diary or patients symptoms were reviewed prior to the start of treatment.      I was immediately available during the procedure.  Delton Coombes, MD

## 2019-08-08 ENCOUNTER — Encounter: Admit: 2019-08-08 | Discharge: 2019-08-08 | Payer: MEDICARE

## 2019-08-09 ENCOUNTER — Encounter: Admit: 2019-08-09 | Discharge: 2019-08-09 | Payer: MEDICARE

## 2019-08-09 DIAGNOSIS — R3915 Urgency of urination: Secondary | ICD-10-CM

## 2019-08-09 NOTE — Progress Notes
Percutaneous Tibial Nerve Stimulation (PTNS)   Treatment number #9of 12     Treatment Administrator:Trechelle HorsleyMA    Percutaneous tibial nerve stimulation (PTNS) was prescribed forAnn M. Casey Overactive Bladder symptoms ofUrinary urgency. The needle electrode was inserted into the lower, inner aspect of theRightleg. The surface electrode was placed on the inside arch of the foot on the treatment leg. The lead set was connected to the stimulator, and the needle electrode clip was connected to the needle electrode. The stimulator that produces an adjustable electrical pulse that travels to the sacral nerve plexus via the tibial nerve was adjusted to a setting of6.The voiding diary or patients symptoms were reviewed prior to the start of treatment.      I was immediately available during the procedure.  Delton Coombes, MD

## 2019-08-15 ENCOUNTER — Encounter: Admit: 2019-08-15 | Discharge: 2019-08-15 | Payer: MEDICARE

## 2019-08-16 ENCOUNTER — Encounter: Admit: 2019-08-16 | Discharge: 2019-08-16 | Payer: MEDICARE

## 2019-08-16 DIAGNOSIS — R3915 Urgency of urination: Secondary | ICD-10-CM

## 2019-08-16 NOTE — Progress Notes
Percutaneous Tibial Nerve Stimulation (PTNS)   Treatment number #10of 12     Treatment Administrator:Trechelle HorsleyMA    Percutaneous tibial nerve stimulation (PTNS) was prescribed forAnn M. Kernions Overactive Bladder symptoms ofUrinary urgency. The needle electrode was inserted into the lower, inner aspect of theRightleg. The surface electrode was placed on the inside arch of the foot on the treatment leg. The lead set was connected to the stimulator, and the needle electrode clip was connected to the needle electrode. The stimulator that produces an adjustable electrical pulse that travels to the sacral nerve plexus via the tibial nerve was adjusted to a setting of7.The voiding diary or patients symptoms were reviewed prior to the start of treatment.      I was immediately available during the procedure.  Delton Coombes, MD

## 2019-09-06 ENCOUNTER — Encounter: Admit: 2019-09-06 | Discharge: 2019-09-06 | Payer: MEDICARE

## 2019-09-06 DIAGNOSIS — R3915 Urgency of urination: Secondary | ICD-10-CM

## 2019-09-06 NOTE — Progress Notes
Percutaneous Tibial Nerve Stimulation (PTNS)   Treatment number #11of 12     Treatment Administrator:Trechelle HorsleyMA    Percutaneous tibial nerve stimulation (PTNS) was prescribed forAnn M. Casey Overactive Bladder symptoms ofUrinary urgency. The needle electrode was inserted into the lower, inner aspect of theLeftleg. The surface electrode was placed on the inside arch of the foot on the treatment leg. The lead set was connected to the stimulator, and the needle electrode clip was connected to the needle electrode. The stimulator that produces an adjustable electrical pulse that travels to the sacral nerve plexus via the tibial nerve was adjusted to a setting of7.The voiding diary or patients symptoms were reviewed prior to the start of treatment.      I was immediately available during the procedure.  Delton Coombes, MD

## 2019-10-09 ENCOUNTER — Encounter: Admit: 2019-10-09 | Discharge: 2019-10-09 | Payer: MEDICARE

## 2019-10-22 ENCOUNTER — Encounter: Admit: 2019-10-22 | Discharge: 2019-10-22 | Payer: MEDICARE

## 2019-11-01 ENCOUNTER — Encounter: Admit: 2019-11-01 | Discharge: 2019-11-01 | Payer: MEDICARE

## 2019-11-04 ENCOUNTER — Encounter: Admit: 2019-11-04 | Discharge: 2019-11-04 | Payer: MEDICARE

## 2019-11-08 ENCOUNTER — Encounter: Admit: 2019-11-08 | Discharge: 2019-11-08 | Payer: MEDICARE

## 2019-11-08 DIAGNOSIS — N952 Postmenopausal atrophic vaginitis: Secondary | ICD-10-CM

## 2019-11-08 DIAGNOSIS — N3946 Mixed incontinence: Secondary | ICD-10-CM

## 2019-11-08 DIAGNOSIS — N390 Urinary tract infection, site not specified: Secondary | ICD-10-CM

## 2019-11-08 DIAGNOSIS — R32 Unspecified urinary incontinence: Secondary | ICD-10-CM

## 2019-11-08 DIAGNOSIS — F329 Major depressive disorder, single episode, unspecified: Secondary | ICD-10-CM

## 2019-11-08 DIAGNOSIS — E785 Hyperlipidemia, unspecified: Secondary | ICD-10-CM

## 2019-11-08 DIAGNOSIS — K5792 Diverticulitis of intestine, part unspecified, without perforation or abscess without bleeding: Secondary | ICD-10-CM

## 2019-11-08 DIAGNOSIS — C50919 Malignant neoplasm of unspecified site of unspecified female breast: Secondary | ICD-10-CM

## 2019-11-19 ENCOUNTER — Encounter: Admit: 2019-11-19 | Discharge: 2019-11-19 | Payer: MEDICARE

## 2019-11-21 ENCOUNTER — Encounter: Admit: 2019-11-21 | Discharge: 2019-11-21 | Payer: MEDICARE

## 2019-11-27 ENCOUNTER — Encounter: Admit: 2019-11-27 | Discharge: 2019-11-27 | Payer: MEDICARE

## 2019-11-29 ENCOUNTER — Encounter: Admit: 2019-11-29 | Discharge: 2019-11-29 | Payer: MEDICARE

## 2019-11-29 DIAGNOSIS — F329 Major depressive disorder, single episode, unspecified: Secondary | ICD-10-CM

## 2019-11-29 DIAGNOSIS — K5792 Diverticulitis of intestine, part unspecified, without perforation or abscess without bleeding: Secondary | ICD-10-CM

## 2019-11-29 DIAGNOSIS — N390 Urinary tract infection, site not specified: Secondary | ICD-10-CM

## 2019-11-29 DIAGNOSIS — C50919 Malignant neoplasm of unspecified site of unspecified female breast: Secondary | ICD-10-CM

## 2019-11-29 DIAGNOSIS — R32 Unspecified urinary incontinence: Secondary | ICD-10-CM

## 2019-11-29 DIAGNOSIS — E785 Hyperlipidemia, unspecified: Secondary | ICD-10-CM

## 2019-11-29 DIAGNOSIS — N3946 Mixed incontinence: Secondary | ICD-10-CM

## 2019-11-29 NOTE — Progress Notes
Ring with knob pessary was removed and cleaned, replaced.   Pt had a small amount vaginal bleeding.     Performed by Kathrin Ruddy, RN      I was immediately available during the procedure.   Theadora Rama, MD

## 2020-02-24 ENCOUNTER — Encounter: Admit: 2020-02-24 | Discharge: 2020-02-24 | Payer: MEDICARE

## 2020-02-25 ENCOUNTER — Encounter: Admit: 2020-02-25 | Discharge: 2020-02-25 | Payer: MEDICARE

## 2020-02-25 MED ORDER — ESTRADIOL 0.01 % (0.1 MG/GRAM) VA CREA
VAGINAL | 3 refills | 30.00000 days | Status: AC
Start: 2020-02-25 — End: ?

## 2020-02-27 ENCOUNTER — Encounter: Admit: 2020-02-27 | Discharge: 2020-02-27 | Payer: MEDICARE

## 2020-02-28 ENCOUNTER — Encounter: Admit: 2020-02-28 | Discharge: 2020-02-28 | Payer: MEDICARE

## 2020-02-28 NOTE — Care Plan
Patient arrived to the clinic with the plan of seeing the CNC of Dr. Antony Salmon RN to have her Pessary change.  Dr. Lorenz Coaster is out of the clinic and so was her nurse.   I did not get return response from [place] to what direction and plan.  I brought in the patient to the clinic room and listened to her and also apologized to the break of communication. She delightfully accepted my apology and was happy to reschedule  And that I will e-mail Morrie Sheldon of this note.  She also informed me that she suspected that she may be starting a UTI and that she'd taken two days worth of Bactrim. I also offered for her that I can call the Urology office and perhaps someone will see her in the main campus.And she rightfully declined to go there.  The patient left the [facility] clinic fully respected and knowing that someone will be calling her to reschedule this appointment.    Jojo Immunologist  CNC [facility] IC-1 EXAM/PROCEDURE

## 2020-03-27 ENCOUNTER — Encounter: Admit: 2020-03-27 | Discharge: 2020-03-27 | Payer: MEDICARE

## 2020-03-27 DIAGNOSIS — F329 Major depressive disorder, single episode, unspecified: Secondary | ICD-10-CM

## 2020-03-27 DIAGNOSIS — R32 Unspecified urinary incontinence: Secondary | ICD-10-CM

## 2020-03-27 DIAGNOSIS — K5792 Diverticulitis of intestine, part unspecified, without perforation or abscess without bleeding: Secondary | ICD-10-CM

## 2020-03-27 DIAGNOSIS — E785 Hyperlipidemia, unspecified: Secondary | ICD-10-CM

## 2020-03-27 DIAGNOSIS — N390 Urinary tract infection, site not specified: Secondary | ICD-10-CM

## 2020-03-27 DIAGNOSIS — C50919 Malignant neoplasm of unspecified site of unspecified female breast: Secondary | ICD-10-CM

## 2020-05-04 ENCOUNTER — Encounter: Admit: 2020-05-04 | Discharge: 2020-05-04 | Payer: MEDICARE

## 2020-06-19 ENCOUNTER — Encounter: Admit: 2020-06-19 | Discharge: 2020-06-19 | Payer: MEDICARE

## 2020-06-19 DIAGNOSIS — R32 Unspecified urinary incontinence: Secondary | ICD-10-CM

## 2020-06-19 DIAGNOSIS — K5792 Diverticulitis of intestine, part unspecified, without perforation or abscess without bleeding: Secondary | ICD-10-CM

## 2020-06-19 DIAGNOSIS — N390 Urinary tract infection, site not specified: Secondary | ICD-10-CM

## 2020-06-19 DIAGNOSIS — F329 Major depressive disorder, single episode, unspecified: Secondary | ICD-10-CM

## 2020-06-19 DIAGNOSIS — E785 Hyperlipidemia, unspecified: Secondary | ICD-10-CM

## 2020-06-19 DIAGNOSIS — C50919 Malignant neoplasm of unspecified site of unspecified female breast: Secondary | ICD-10-CM

## 2020-06-19 MED ORDER — SULFAMETHOXAZOLE-TRIMETHOPRIM 800-160 MG PO TAB
1 | ORAL_TABLET | Freq: Two times a day (BID) | ORAL | 0 refills | Status: AC
Start: 2020-06-19 — End: ?

## 2020-09-04 ENCOUNTER — Encounter: Admit: 2020-09-04 | Discharge: 2020-09-04 | Payer: MEDICARE

## 2020-09-04 DIAGNOSIS — K5792 Diverticulitis of intestine, part unspecified, without perforation or abscess without bleeding: Secondary | ICD-10-CM

## 2020-09-04 DIAGNOSIS — F32A Depression: Secondary | ICD-10-CM

## 2020-09-04 DIAGNOSIS — E785 Hyperlipidemia, unspecified: Secondary | ICD-10-CM

## 2020-09-04 DIAGNOSIS — C50919 Malignant neoplasm of unspecified site of unspecified female breast: Secondary | ICD-10-CM

## 2020-09-04 DIAGNOSIS — R32 Unspecified urinary incontinence: Secondary | ICD-10-CM

## 2020-09-04 DIAGNOSIS — N390 Urinary tract infection, site not specified: Secondary | ICD-10-CM

## 2020-09-04 MED ORDER — LIDOCAINE HCL 2 % MM JELP
Freq: Once | TOPICAL | 0 refills | Status: CP
Start: 2020-09-04 — End: ?
  Administered 2020-09-04: 18:00:00 20.000 mL via TOPICAL

## 2020-09-04 MED ORDER — HYOSCYAMINE SULFATE 0.125 MG PO TAB
125 ug | ORAL_TABLET | Freq: Every evening | ORAL | 3 refills | Status: AC
Start: 2020-09-04 — End: ?

## 2020-12-04 ENCOUNTER — Encounter: Admit: 2020-12-04 | Discharge: 2020-12-04 | Payer: MEDICARE

## 2020-12-04 DIAGNOSIS — C50919 Malignant neoplasm of unspecified site of unspecified female breast: Secondary | ICD-10-CM

## 2020-12-04 DIAGNOSIS — R32 Unspecified urinary incontinence: Secondary | ICD-10-CM

## 2020-12-04 DIAGNOSIS — N390 Urinary tract infection, site not specified: Secondary | ICD-10-CM

## 2020-12-04 DIAGNOSIS — E785 Hyperlipidemia, unspecified: Secondary | ICD-10-CM

## 2020-12-04 DIAGNOSIS — F32A Depression: Secondary | ICD-10-CM

## 2020-12-04 DIAGNOSIS — K5792 Diverticulitis of intestine, part unspecified, without perforation or abscess without bleeding: Secondary | ICD-10-CM

## 2020-12-04 DIAGNOSIS — N3946 Mixed incontinence: Secondary | ICD-10-CM

## 2020-12-04 NOTE — Progress Notes
Urology    CC:  Urinary Incontinence      HPI:  Tina Casey is a 87 y.o. female with PMH significant for breast cancer,?s/p?hysterectomy?who has?mixed urinary incontinence.?  ?  Urethral bulking improved her incontinence in 2011.?She has tried oxybutynin as well as mirabegron without any improvement in her symptoms. Her?brother is a urologist.  ?  12/29/17 vag atrophy, approx 1cm cystic lesion tender to palpation anterior vaginal wall?that seems to be peri-urethral at the mid urethra, palpation of the lesion does not express any fluid from the meatus, neg cough stress test?  ?  Imaging:??Pelvic MRI- no vaginal wall cysts, no urethral diverticulum, mild inflammation from sigmoid colon to superior bladder without?evidence of fistula  ?  02/02/2018,?no change in her symptoms. She presents for UDS and cystoscopy.  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  07/16/2018, underwent macroplastique urethral bulking. This resolved her incontinence for 2 months before it returned.?She has not wanted to do any further procedures. She agreed to try a pessary. #0 pessary ring with knob placed.  ?    Today, 12/04/2020, someday the urinary incontinence is good and other days it is worse. She is not sure if the pessary is helping.The sister that helps with her pessary sometimes is retiring from providing healthcare. She has been started on daily macrobid by her PCP. She stopped the levsin because did not feel it was helping.      PMH:  Medical History:   Diagnosis Date   ? Breast cancer (HCC)    ? Depression    ? Diverticulitis    ? Dyslipidemia    ? Incontinence    ? UTI (urinary tract infection)         PSH:  Surgical History:   Procedure Laterality Date   ? ENDOSCOPIC INJECTION IMPLANT MATERIAL INTO URETHRA/ BLADDER NECK (MACROPLASTIQUE) N/A 07/16/2018    Performed by Venetia Maxon, MD at [facility] OR   ? CATARACT REMOVAL     ? HX APPENDECTOMY     ? HX CHOLECYSTECTOMY     ? HX HYSTERECTOMY     ? JOINT REPLACEMENT     ? MASTECTOMY     ? TONSILLECTOMY         Medications:  Current Outpatient Medications   Medication Sig Dispense Refill   ? acetaminophen SR(+) (TYLENOL) 650 mg tablet Take one tablet by mouth every 6 hours as needed for Pain. 30 tablet    ? aspirin EC 81 mg tablet Take 81 mg by mouth daily. Take with food.     ? atorvastatin (LIPITOR) 20 mg tablet Take 20 mg by mouth daily.  3   ? CALCIUM PO Take 600 mg by mouth daily.     ? cholecalciferol (VITAMIN D-3) 1,000 units tablet Take 1,000 Units by mouth daily.     ? cranberry fruit extract (CRANBERRY PO) Take  by mouth as Needed.     ? estradioL (ESTRACE) 0.01 % (0.1 mg/g) vaginal cream Insert or Apply  to vaginal area three times weekly. Insert pea-sized amount into vagina 3 nights a week. 42.5 g 3   ? fluticasone propionate (FLONASE NA) Apply 2 sprays into nose as directed daily.     ? hyoscyamine sulfate (LEVSIN) 0.125 mg tablet Take one tablet by mouth at bedtime daily. 30 tablet 3   ? Omega-3-DHA-EPA-Fish Oil (FISH OIL) 1,000 mg (120 mg-180 mg)  cap Take  by mouth daily.     ? pantoprazole DR (PROTONIX) 40 mg tablet Take 40 mg by mouth daily.  3   ? sertraline (ZOLOFT) 50 mg tablet Take 50 mg by mouth daily.  3       Allergies:  Ciprofloxacin and Penicillins    Social History:  Social History     Substance and Sexual Activity   Alcohol Use Yes   ? Alcohol/week: 1.0 standard drink   ? Types: 1 Shots of liquor per week      Social History     Tobacco Use   Smoking Status Never Smoker   Smokeless Tobacco Never Used      Social History     Substance and Sexual Activity   Drug Use Never       Family History:  Family History   Problem Relation Age of Onset   ? Cancer Other    ? Diabetes Other    ? Heart Disease Other    ? Stroke Other        Physical Exam:  Vitals:    12/04/20 1045   BP: (!) 156/79   Pulse: 67   Resp: 14   Temp: 36.6 ?C (97.9 ?F)   SpO2: 100%       Gen: well-developed, NAD  HEENT: normocephalic, conjuctiva clear, mucous membranes moist  Respiratory: normal respiratory effort with symmetric chest expansion  Skin: Warm, No jaundice  Musculoskeletal: No edema, no gross deformity  Neuro: Awake, alert, no focal deficits, normal gait  Psychiatric: Normal affect  Vaginal mucosa intact      Diagnostics:    Labs:   Creatinine   Date Value Ref Range Status   07/16/2018 1.45 (H) 0.4 - 1.00 MG/DL Final     Culture   Date Value Ref Range Status   07/06/2018 >100,000 organisms/ml  ESCHERICHIA COLI   (A)  Final       Assessment/Plan:        - we decided to keep pessary out today to see how she does over the next month. If her urinary incontinence is worse then she will call to schedule an appointment to have it replaced. Otherwise we will touch base by phone in one month.       1. Mixed stress and urge urinary incontinence         Return in about 4 weeks (around 01/01/2021) for telephone call- okay to overbook.        Britt Bolognese, MD

## 2020-12-07 ENCOUNTER — Encounter: Admit: 2020-12-07 | Discharge: 2020-12-07 | Payer: MEDICARE

## 2021-02-06 ENCOUNTER — Encounter: Admit: 2021-02-06 | Discharge: 2021-02-06 | Payer: MEDICARE

## 2021-03-12 ENCOUNTER — Encounter: Admit: 2021-03-12 | Discharge: 2021-03-12 | Payer: MEDICARE

## 2021-03-12 DIAGNOSIS — E785 Hyperlipidemia, unspecified: Secondary | ICD-10-CM

## 2021-03-12 DIAGNOSIS — C50919 Malignant neoplasm of unspecified site of unspecified female breast: Secondary | ICD-10-CM

## 2021-03-12 DIAGNOSIS — F32A Depression: Secondary | ICD-10-CM

## 2021-03-12 DIAGNOSIS — R32 Unspecified urinary incontinence: Secondary | ICD-10-CM

## 2021-03-12 DIAGNOSIS — N390 Urinary tract infection, site not specified: Secondary | ICD-10-CM

## 2021-03-12 DIAGNOSIS — K5792 Diverticulitis of intestine, part unspecified, without perforation or abscess without bleeding: Secondary | ICD-10-CM

## 2021-03-12 DIAGNOSIS — N3946 Mixed incontinence: Secondary | ICD-10-CM

## 2021-03-12 NOTE — Progress Notes
Date of Service: 03/12/2021     Subjective:             Tina Casey is a 87 y.o. female who presents for follow up.    History of Present Illness  Tina Casey is a 87 y.o. female with PMH significant for breast cancer,?s/p?hysterectomy?who has?mixed urinary incontinence.?  ?  Urethral bulking improved her incontinence in 2011.?She has tried oxybutynin as well as mirabegron without any improvement in her symptoms. Her?brother is a urologist.  ?  12/29/17 vag atrophy, approx 1cm cystic lesion tender to palpation anterior vaginal wall?that seems to be peri-urethral at the mid urethra, palpation of the lesion does not express any fluid from the meatus, neg cough stress test?  ?  Imaging:??Pelvic MRI- no vaginal wall cysts, no urethral diverticulum, mild inflammation from sigmoid colon to superior bladder without?evidence of fistula  ?  02/02/2018,?no change in her symptoms. She presents for UDS and cystoscopy.  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  07/16/2018, underwent macroplastique urethral bulking. This resolved her incontinence for 2 months before it returned.?She has not wanted to do any further procedures. She agreed to try a pessary.?#0 pessary ring with knob placed.  ?  ?  12/04/2020,?someday the urinary incontinence is good and other days it is worse. She is not sure if the pessary is helping.The sister that helps with her pessary sometimes is retiring from providing healthcare. She has been started on daily macrobid by her PCP. She stopped the levsin because did not feel it was helping.  ?  01/18/2021, will wake up, then has urinary incontinence. But during the day she is doing okay. Overall she feels things are about the slightly worse but she would like to continue without the pessary and would like to do observation. Limits fluids after 6:15pm.    Today 03/12/2021, Patient continues to experience incontinence overnight that requires her to wear a thick pad and depends to prevent leaking. She wakes up in the morning and has some urge incontinence. Throughout the day she will experience some leakage of urine if she stands up. After she has a bowel movement she notices the leaking will stop. Patient is open to hearing any other options to help with her incontinence. She states that the leakage during the day is what is most bothersome.       Medical History:   Diagnosis Date   ? Breast cancer (HCC)    ? Depression    ? Diverticulitis    ? Dyslipidemia    ? Incontinence    ? UTI (urinary tract infection)        Surgical History:   Procedure Laterality Date   ? ENDOSCOPIC INJECTION IMPLANT MATERIAL INTO URETHRA/ BLADDER NECK (MACROPLASTIQUE) N/A 07/16/2018    Performed by Venetia Maxon, MD at [facility] OR   ? CATARACT REMOVAL     ? HX APPENDECTOMY     ? HX CHOLECYSTECTOMY     ? HX HYSTERECTOMY     ? JOINT REPLACEMENT     ? MASTECTOMY     ? TONSILLECTOMY         Family History   Problem Relation Age of Onset   ? Cancer Other    ? Diabetes Other    ? Heart Disease Other    ? Stroke Other        Current Outpatient  Medications   Medication Sig Dispense Refill   ? acetaminophen SR(+) (TYLENOL) 650 mg tablet Take one tablet by mouth every 6 hours as needed for Pain. 30 tablet    ? aspirin EC 81 mg tablet Take 81 mg by mouth daily. Take with food.     ? atorvastatin (LIPITOR) 20 mg tablet Take 20 mg by mouth daily.  3   ? CALCIUM PO Take 600 mg by mouth daily.     ? cholecalciferol (VITAMIN D-3) 1,000 units tablet Take 1,000 Units by mouth daily.     ? cranberry fruit extract (CRANBERRY PO) Take  by mouth as Needed.     ? estradioL (ESTRACE) 0.01 % (0.1 mg/g) vaginal cream Insert or Apply  to vaginal area three times weekly. Insert pea-sized amount into vagina 3 nights a week. 42.5 g 3   ? Omega-3-DHA-EPA-Fish Oil 1,000 mg (120 mg-180 mg) capsule Take  by mouth daily.     ? pantoprazole DR (PROTONIX) 40 mg tablet Take 40 mg by mouth daily.  3   ? sertraline (ZOLOFT) 50 mg tablet Take 50 mg by mouth daily.  3     No current facility-administered medications for this visit.       Allergies   Allergen Reactions   ? Ciprofloxacin UNKNOWN   ? Penicillins UNKNOWN       Social History     Socioeconomic History   ? Marital status: Single   Tobacco Use   ? Smoking status: Never Smoker   ? Smokeless tobacco: Never Used   Substance and Sexual Activity   ? Alcohol use: Yes     Alcohol/week: 1.0 standard drink     Types: 1 Shots of liquor per week   ? Drug use: Never       Objective:         ? acetaminophen SR(+) (TYLENOL) 650 mg tablet Take one tablet by mouth every 6 hours as needed for Pain.   ? aspirin EC 81 mg tablet Take 81 mg by mouth daily. Take with food.   ? atorvastatin (LIPITOR) 20 mg tablet Take 20 mg by mouth daily.   ? CALCIUM PO Take 600 mg by mouth daily.   ? cholecalciferol (VITAMIN D-3) 1,000 units tablet Take 1,000 Units by mouth daily.   ? cranberry fruit extract (CRANBERRY PO) Take  by mouth as Needed.   ? estradioL (ESTRACE) 0.01 % (0.1 mg/g) vaginal cream Insert or Apply  to vaginal area three times weekly. Insert pea-sized amount into vagina 3 nights a week.   ? Omega-3-DHA-EPA-Fish Oil 1,000 mg (120 mg-180 mg) capsule Take  by mouth daily.   ? pantoprazole DR (PROTONIX) 40 mg tablet Take 40 mg by mouth daily.   ? sertraline (ZOLOFT) 50 mg tablet Take 50 mg by mouth daily.     Vitals:    03/12/21 1512   BP: (!) 151/61   BP Source: Arm, Right Upper   Pulse: 62   Temp: 36.8 ?C (98.2 ?F)   Resp: 18   SpO2: 99%   TempSrc: Temporal   PainSc: Zero   Weight: 71.3 kg (157 lb 3.2 oz)   Height: 152.4 cm (5')     Body mass index is 30.7 kg/m?Marland Kitchen     Physical Exam  Constitutional:       General: She is not in acute distress.     Appearance: Normal appearance. She is normal weight. She is not ill-appearing.   HENT:  Head: Normocephalic.   Cardiovascular:      Rate and Rhythm: Normal rate.   Pulmonary:      Effort: Pulmonary effort is normal.   Skin:     General: Skin is warm and dry.   Neurological:      General: No focal deficit present.      Mental Status: She is alert and oriented to person, place, and time. Mental status is at baseline.   Psychiatric:         Mood and Affect: Mood normal.         Behavior: Behavior normal.            Assessment and Plan:  Tina Casey is an 87 y.o. female with mixed urinary incontinence. She is primarily bothered by the leakage she experiences throughout the day. We discussed Botox injections into the bladder to help with her incontinence especially during the night to help her stay dry. The injections last about 6 months before we would need to re administer the Botox to the bladder. Patient was not opposed to this options but states that she would like to think about it and if things get worse or she can no longer tolerate her symptoms she will call to schedule the botox injections.     -Follow up as needed       Patient seen and discussed with Dr. Lorenz Coaster, who directed plan of care.    [place]  Wordekem[facility] MS4            ATTESTATION    I personally performed the key portions of the E/M visit, discussed case with resident and concur with resident documentation of history, physical exam, assessment, and treatment plan unless otherwise noted.    She has some SUI symptoms during the day. She is primarily bothered by nocturnal enuresis. She has tried medications in the past. She would not be able to do PTNS due to time and visit needs. We discussed botox into the bladder. She would like to consider this and let me know.    Staff name:  Britt Bolognese, MD Date:  03/12/2021

## 2021-04-27 ENCOUNTER — Encounter: Admit: 2021-04-27 | Discharge: 2021-04-27 | Payer: MEDICARE

## 2021-06-17 ENCOUNTER — Encounter: Admit: 2021-06-17 | Discharge: 2021-06-17 | Payer: MEDICARE

## 2021-06-25 ENCOUNTER — Encounter: Admit: 2021-06-25 | Discharge: 2021-06-25 | Payer: MEDICARE

## 2021-06-25 MED ORDER — MYRBETRIQ 50 MG PO TB24
50 mg | ORAL_TABLET | Freq: Every day | ORAL | 0 refills | Status: AC
Start: 2021-06-25 — End: ?

## 2021-07-30 ENCOUNTER — Encounter: Admit: 2021-07-30 | Discharge: 2021-07-30 | Payer: MEDICARE

## 2021-07-30 DIAGNOSIS — N390 Urinary tract infection, site not specified: Secondary | ICD-10-CM

## 2021-07-30 DIAGNOSIS — N3941 Urge incontinence: Secondary | ICD-10-CM

## 2021-07-30 DIAGNOSIS — R32 Unspecified urinary incontinence: Secondary | ICD-10-CM

## 2021-07-30 DIAGNOSIS — K5792 Diverticulitis of intestine, part unspecified, without perforation or abscess without bleeding: Secondary | ICD-10-CM

## 2021-07-30 DIAGNOSIS — F32A Depression: Secondary | ICD-10-CM

## 2021-07-30 DIAGNOSIS — E785 Hyperlipidemia, unspecified: Secondary | ICD-10-CM

## 2021-07-30 DIAGNOSIS — C50919 Malignant neoplasm of unspecified site of unspecified female breast: Secondary | ICD-10-CM

## 2021-07-30 MED ORDER — WATER FOR INJECTION, STERILE IV SOLP
1000 mL | Freq: Once | 0 refills | Status: CP
Start: 2021-07-30 — End: ?
  Administered 2021-07-30: 18:00:00 1000 mL

## 2021-07-30 MED ORDER — ONABOTULINUMTOXINA 100 UNIT IJ SOLR
100 [IU] | Freq: Once | INTRAMUSCULAR | 0 refills | Status: CP
Start: 2021-07-30 — End: ?
  Administered 2021-07-30: 18:00:00 100 [IU] via INTRAMUSCULAR

## 2021-07-30 MED ORDER — LIDOCAINE HCL 10 MG/ML (1 %) IJ SOLN
50 mL | Freq: Once | 0 refills | Status: CP
Start: 2021-07-30 — End: ?
  Administered 2021-07-30: 18:00:00 50 mL

## 2021-07-30 MED ORDER — LIDOCAINE HCL 2 % MM JELP
Freq: Once | TOPICAL | 0 refills | Status: CP
Start: 2021-07-30 — End: ?
  Administered 2021-07-30: 18:00:00 11.000 mL via TOPICAL

## 2021-07-30 MED ORDER — SODIUM CHLORIDE 0.9 % IJ SOLN
25 mL | Freq: Once | 0 refills | Status: CP
Start: 2021-07-30 — End: ?
  Administered 2021-07-30: 18:00:00 20 mL

## 2021-07-30 MED ORDER — SULFAMETHOXAZOLE-TRIMETHOPRIM 800-160 MG PO TAB
1 | Freq: Once | ORAL | 0 refills | Status: CP
Start: 2021-07-30 — End: ?
  Administered 2021-07-30: 18:00:00 1 via ORAL

## 2021-07-30 NOTE — Procedures
Procedure:                1. Cystoscopy               2. Chemodenervation of bladder with Botox injection     Findings:   - Uncomplicated injection of 100 U of botox into the bladder     Indications for Operative Procedure:  87 year old female with urgency incontinence who presents today for intradetrusor Botox injection.     Description of Operative Procedure:   After informed consent was obtained and signed, the patient was taken back to the procedure room. A time-out was performed, verifying procedure details and the patient's identity.  The genitals were then prepped and draped in usual sterile fashion. Straight catheterization of the bladder was performed and 50 mL of 1% lidocaine instilled.   A 16-French flexible cystoscope was inserted atraumatically into the urethra and bladder. There were no urethral lesions noted. Pancystoscopy was then performed including complete examination of the dome and anterior bladder wall. There were no bladder masses noted. Next, we inserted a Botox injection needle through the working port of the injection apparatus on the cystoscope.  100 units of Botox was then injected systematically in grid pattern in a supratrigonal fashion. There was good hemostasis noted after injection of all 100 units. The patient's bladder was then drained.       Estimated Blood Loss: Minimal     Implants:  None     Drains: None     Complications: None     Specimens: None     Disposition/Plan:  - follow up in 4 weeks with telephone call for symptoms check. If having any issues will have her come in for a PVR    Theadora Rama, MD

## 2021-08-03 ENCOUNTER — Encounter: Admit: 2021-08-03 | Discharge: 2021-08-03 | Payer: MEDICARE

## 2021-08-31 ENCOUNTER — Encounter: Admit: 2021-08-31 | Discharge: 2021-08-31 | Payer: MEDICARE

## 2021-11-19 ENCOUNTER — Encounter: Admit: 2021-11-19 | Discharge: 2021-11-19 | Payer: MEDICARE

## 2021-11-19 DIAGNOSIS — R32 Unspecified urinary incontinence: Secondary | ICD-10-CM

## 2021-11-19 DIAGNOSIS — K5792 Diverticulitis of intestine, part unspecified, without perforation or abscess without bleeding: Secondary | ICD-10-CM

## 2021-11-19 DIAGNOSIS — N390 Urinary tract infection, site not specified: Secondary | ICD-10-CM

## 2021-11-19 DIAGNOSIS — F32A Depression: Secondary | ICD-10-CM

## 2021-11-19 DIAGNOSIS — C50919 Malignant neoplasm of unspecified site of unspecified female breast: Secondary | ICD-10-CM

## 2021-11-19 DIAGNOSIS — E785 Hyperlipidemia, unspecified: Secondary | ICD-10-CM

## 2021-11-19 MED ORDER — WATER FOR INJECTION, STERILE IV SOLP
1000 mL | Freq: Once | 0 refills | Status: CP
Start: 2021-11-19 — End: ?
  Administered 2021-11-19: 19:00:00 1000 mL

## 2021-12-24 ENCOUNTER — Encounter: Admit: 2021-12-24 | Discharge: 2021-12-24 | Payer: MEDICARE

## 2021-12-24 DIAGNOSIS — F32A Depression: Secondary | ICD-10-CM

## 2021-12-24 DIAGNOSIS — R32 Unspecified urinary incontinence: Secondary | ICD-10-CM

## 2021-12-24 DIAGNOSIS — E785 Hyperlipidemia, unspecified: Secondary | ICD-10-CM

## 2021-12-24 DIAGNOSIS — N390 Urinary tract infection, site not specified: Secondary | ICD-10-CM

## 2021-12-24 DIAGNOSIS — C50919 Malignant neoplasm of unspecified site of unspecified female breast: Secondary | ICD-10-CM

## 2021-12-24 DIAGNOSIS — N3944 Nocturnal enuresis: Secondary | ICD-10-CM

## 2021-12-24 DIAGNOSIS — K5792 Diverticulitis of intestine, part unspecified, without perforation or abscess without bleeding: Secondary | ICD-10-CM

## 2021-12-24 NOTE — Progress Notes
Urology    CC:  Follow Up      HPI:  Tina Casey is a 88 y.o. female with PMH significant for breast cancer,?s/p?hysterectomy?who has?mixed urinary incontinence.?  ?  Urethral bulking improved her incontinence in 2011.?She has tried oxybutynin as well as mirabegron without any improvement in her symptoms. Her?brother is a urologist.  ?  12/29/17 vag atrophy, approx 1cm cystic lesion tender to palpation anterior vaginal wall?that seems to be peri-urethral at the mid urethra, palpation of the lesion does not express any fluid from the meatus, neg cough stress test?  ?  Imaging:??Pelvic MRI- no vaginal wall cysts, no urethral diverticulum, mild inflammation from sigmoid colon to superior bladder without?evidence of fistula  ?  02/02/2018,?no change in her symptoms. She presents for UDS and cystoscopy.  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  12/04/2020,?someday the urinary incontinence is good and other days it is worse. She is not sure if the pessary is helping.The sister that helps with her pessary sometimes is retiring from providing healthcare. She has been started on daily macrobid by her PCP. She stopped the levsin because did not feel it was helping.  ?  01/18/2021,?will wake up, then has urinary incontinence. But during the day she is doing okay. Overall she feels things are about the slightly worse but she would like to continue without the pessary and would like to do observation. Limits fluids after 6:15pm.  ?  07/30/21?botox 100 units  ?  11/19/2021, primarily bothered by nocturnal enuresis. She did not notice an appreciable improvement after the botox other than for about 6 weeks she had difficulty emptying her bladder. She has now moved to nursing home as the bathroom is closer for her in these rooms.  She is on lasix 3 days per week.    Today, 12/24/2021, Just returned on a trip from [place].  Leak some days more than others. Using two pads and a depends at night. Would prefer not to live with the urinary incontinence.      PMH:  Medical History:   Diagnosis Date   ? Breast cancer (HCC)    ? Depression    ? Diverticulitis    ? Dyslipidemia    ? Incontinence    ? UTI (urinary tract infection)         PSH:  Surgical History:   Procedure Laterality Date   ? ENDOSCOPIC INJECTION IMPLANT MATERIAL INTO URETHRA/ BLADDER NECK (MACROPLASTIQUE) N/A 07/16/2018    Performed by Venetia Maxon[facility] MD at [facility] OR   ? CATARACT REMOVAL     ? HX APPENDECTOMY     ? HX CHOLECYSTECTOMY     ? HX HYSTERECTOMY     ? JOINT REPLACEMENT     ? MASTECTOMY     ? TONSILLECTOMY         Medications:  Current Outpatient Medications   Medication Sig Dispense Refill   ? acetaminophen SR(+) (TYLENOL) 650 mg tablet Take one tablet by mouth every 6 hours as needed for Pain. 30 tablet    ? aspirin EC 81 mg tablet Take one tablet by mouth daily. Take with food.     ? cranberry fruit extract (CRANBERRY PO) Take  by mouth as Needed.     ? estradioL (ESTRACE) 0.01 % (0.1 mg/g) vaginal cream Insert or Apply  to vaginal area three times weekly. Insert pea-sized amount into  vagina 3 nights a week. 42.5 g 3   ? pantoprazole DR (PROTONIX) 40 mg tablet Take one tablet by mouth daily.  3   ? sertraline (ZOLOFT) 50 mg tablet Take one tablet by mouth daily.  3       Allergies:  Ciprofloxacin and Penicillins    Social History:  Social History     Substance and Sexual Activity   Alcohol Use Yes   ? Alcohol/week: 1.0 standard drink   ? Types: 1 Shots of liquor per week      Social History     Tobacco Use   Smoking Status Never   Smokeless Tobacco Never      Social History     Substance and Sexual Activity   Drug Use Never       Family History:  Family History   Problem Relation Age of Onset   ? Cancer Other    ? Diabetes Other    ? Heart Disease Other    ? Stroke Other        Physical Exam:  Vitals:    12/24/21 0910 BP: 120/75     Telephone call    Diagnostics:        Labs:   Creatinine   Date Value Ref Range Status   07/16/2018 1.45 (H) 0.4 - 1.00 MG/DL Final     Culture   Date Value Ref Range Status   07/06/2018 >100,000 organisms/ml  ESCHERICHIA COLI   (A)  Final       Assessment/Plan:        Have not identified SUI since her bulking.     Primary bother is nocturia and nocturnal enuresis. Would like to continue with how she is doing.   We discussed SNM. She does not want surgery. Has already tried multiple meds and botox.  We discussed indwelling catheter- not interseted.  She will call to make a follow up appointment if things change.     1. Nocturnal enuresis                  Britt Bolognese, MD      Total time 5 minutes.

## 2021-12-28 ENCOUNTER — Encounter: Admit: 2021-12-28 | Discharge: 2021-12-28 | Payer: MEDICARE

## 2021-12-28 DIAGNOSIS — N3946 Mixed incontinence: Secondary | ICD-10-CM

## 2022-01-07 ENCOUNTER — Encounter: Admit: 2022-01-07 | Discharge: 2022-01-07 | Payer: MEDICARE

## 2022-03-21 ENCOUNTER — Encounter: Admit: 2022-03-21 | Discharge: 2022-03-21 | Payer: MEDICARE

## 2022-05-22 IMAGING — MG MM mammogram 2D screen RT
1 series · 2 of 2 positions shown · non-contrast
Comparison: none

[Series 2: R CC · right · 2 of 2 slices shown]
[im 1/2]
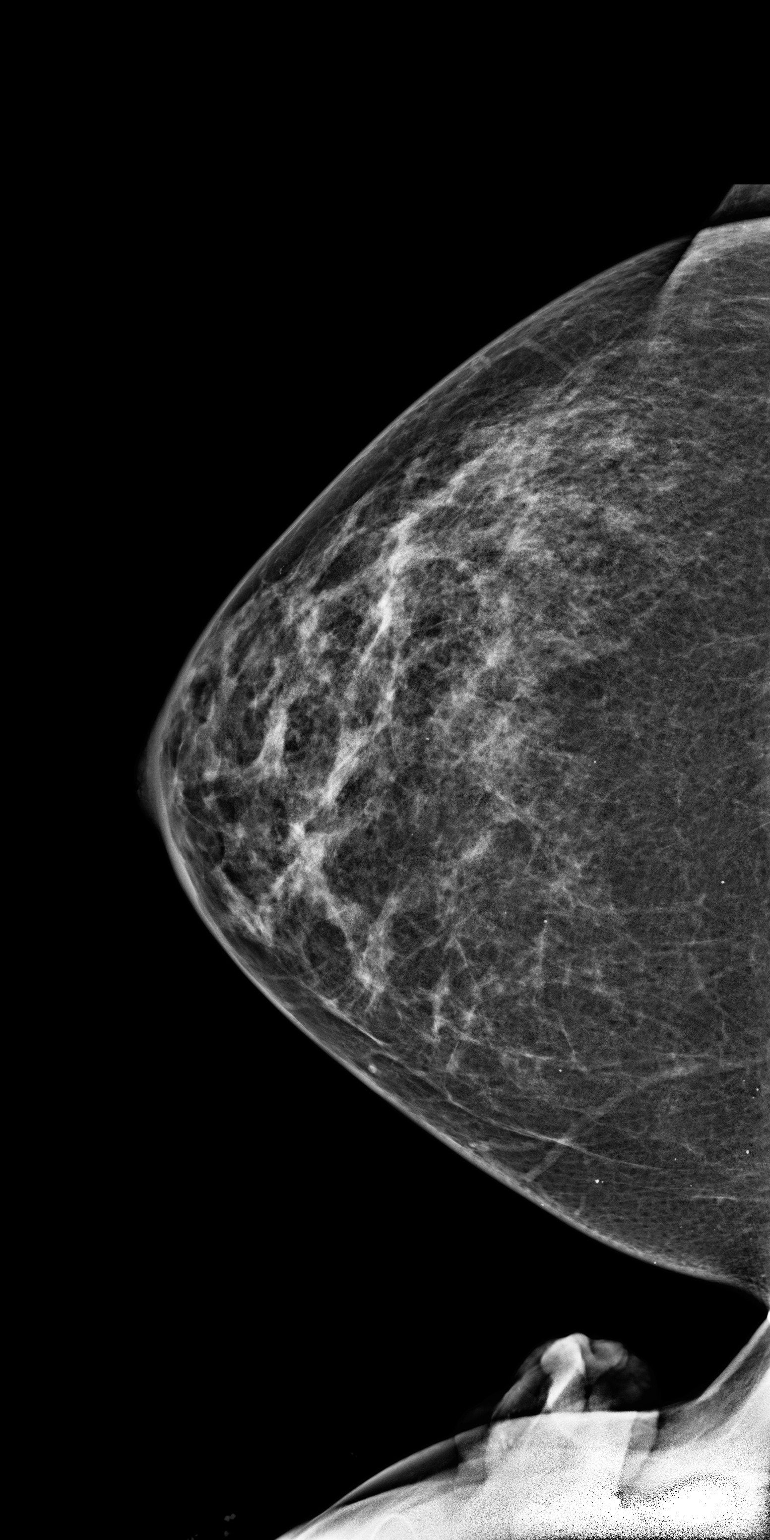
[im 2/2]
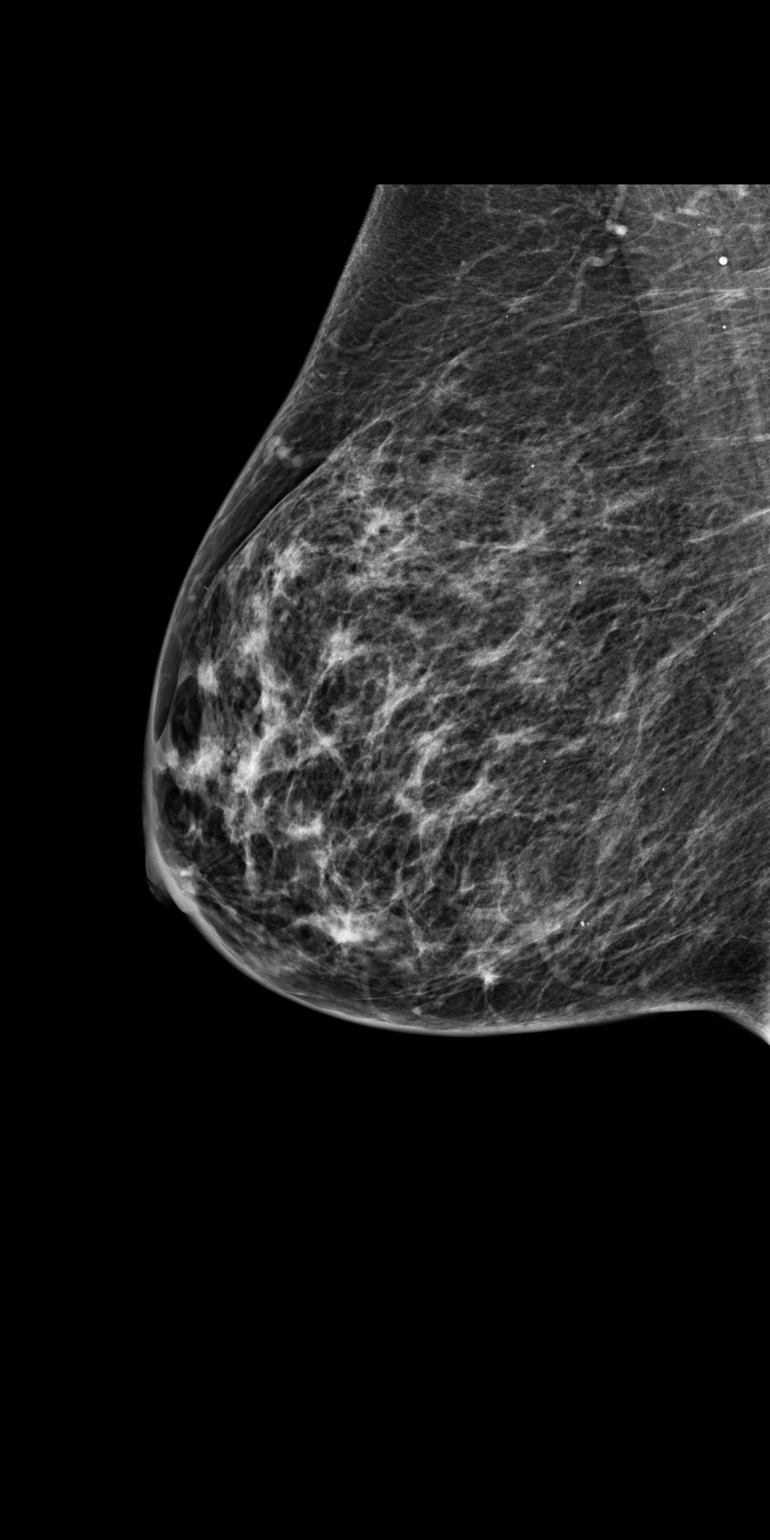

[2 of 2 positions shown; findings below may reference images not displayed]

EXAM

SCREENING MAMMOGRAM, unilateral

INDICATION

screening
HX. LT BREAST CA 4930. MASTECTOMY, NO CHEMO OR RADIATION. SCREENING RT. AB (2D) PRIORS: 2122.

TECHNIQUE

Bilateral craniocaudal and medial lateral oblique views were generated and reviewed.

COMPARISONS

January 15, 2021

FINDINGS

ACR Type 2: 25-50% There are scattered fibroglandular densities.

Scattered unchanged calcifications are seen. No concerning masses are evident.

IMPRESSION

Stable right mammogram.

BI-RADS 2, BENIGN.

Tech Notes:

## 2022-06-10 ENCOUNTER — Encounter: Admit: 2022-06-10 | Discharge: 2022-06-10 | Payer: MEDICARE

## 2022-06-14 ENCOUNTER — Encounter: Admit: 2022-06-14 | Discharge: 2022-06-14 | Payer: MEDICARE

## 2022-06-14 ENCOUNTER — Ambulatory Visit: Admit: 2022-06-14 | Discharge: 2022-06-14 | Payer: MEDICARE

## 2022-06-14 DIAGNOSIS — F32A Depression: Secondary | ICD-10-CM

## 2022-06-14 DIAGNOSIS — E785 Hyperlipidemia, unspecified: Secondary | ICD-10-CM

## 2022-06-14 DIAGNOSIS — R32 Unspecified urinary incontinence: Secondary | ICD-10-CM

## 2022-06-14 DIAGNOSIS — N390 Urinary tract infection, site not specified: Secondary | ICD-10-CM

## 2022-06-14 DIAGNOSIS — N3946 Mixed incontinence: Secondary | ICD-10-CM

## 2022-06-14 DIAGNOSIS — K5792 Diverticulitis of intestine, part unspecified, without perforation or abscess without bleeding: Secondary | ICD-10-CM

## 2022-06-14 DIAGNOSIS — C50919 Malignant neoplasm of unspecified site of unspecified female breast: Secondary | ICD-10-CM

## 2022-06-14 MED ORDER — SULFAMETHOXAZOLE-TRIMETHOPRIM 800-160 MG PO TAB
1 | Freq: Once | ORAL | 0 refills | Status: CP
Start: 2022-06-14 — End: ?
  Administered 2022-06-14: 20:00:00 1 via ORAL

## 2022-06-14 NOTE — Patient Instructions
Department of Urology   [phone]  Pre-Operative Instructions    Surgical Procedure:  Axonics Stage I and Stage II  Date of Surgery:  07/07/22 and 07/14/22      To ensure that your surgery can proceed without delay, you will be contacted by a phone triage nurse from the Preoperative Assessment Clinic [facility]) to complete this process.  Please review the information given to you by your surgeon.    You will be called by the surgery staff with your day of surgery arrival time between 2:30 - 4:30 PM the business day prior to surgery. If you have not heard from them after 4:30 PM, please call [phone] to confirm your arrival time.          *YOU MUST HAVE A DRIVER TO BRING YOU HOME AFTER YOUR PROCEDURE. This can NOT be a driving service (Urber, Lyft, etc) or taxi service.    PARKING: Public parking is available in the [address] parking garage, located across from the [facility] entrance.      [facility] is a teaching hospital.  Resident physicians and medical students will be involved in your care in multiple aspects, including but not limited to the ambulatory (clinic) setting, inpatient care and the operating room.    Pre-Operative Assessment and Instructions:    Once you speak to the nurse or are seen in the Pre-Operative Assessment Clinic, you will be given medication instructions. However, if surgery is within 2 weeks, please read and follow the medication instructions below to prepare for surgery before you speak with the phone triage nurse:    DO NOT STOP your blood thinner until you have contacted your prescribing provider or have been specifically instructed to do so.    Starting Now:  Contact your provider who prescribes any of the following to develop a plan for surgery:  Blood thinners such as aspirin, Aggrenox, Brilinta, Effient, Eliquis, enoxaparin (Lovenox), clopidogrel (Plavix), cilostazol, pentoxifylline (Trental), Pradaxa, Savaysa, ticlopidine, Xarelto, and warfarin (Coumadin)  Immunosuppresants such as methotrexate, azathioprine, sulfasalazine, everolimus, sirolimus, Humira, Remicade, Enbrel, Simponi, Orencia, Cimzia, Actemra, and Xeljanz  Chemotherapy    14 days prior to surgery:  Stop most vitamins, herbals, and supplements including (but not limited to):  Alpha lipoic acid, black cohosh, CoQ10, echinacea, eye vitamins, fish oil, flaxseed oil, garlic, gingko biloba, ginseng, glucosamine/chondroitin, kava, Lovaza, lutein, lysine, multivitamin, red yeast rice, SAM-e, saw palmetto, St. John?s wort, turmeric, valerian root, Vascepa, Vitamin A, Vitamin B complex, Vitamin C, Vitamin E  You DO NOT need to stop: iron, magnesium, potassium    7 days prior to surgery:  Stop anti-inflammatory medications such as ibuprofen (Advil, Motrin), naproxen (Aleve), Alka-Seltzer, Excedrin, Midol, celecoxib (Celebrex), diclofenac (Voltaren), diflunisal, etodolac, flurbiprofen, indomethacin, ketoprofen, ketorolac, meloxicam, nabumetone, and piroxicam  Okay to take Tylenol    ========================================================================    Do not drink alcohol within 24 hours of surgery.    Please do not eat or drink anything after midnight.  No gum, mints, hard candy, snacks, coffee, etc or chewing tobacco, cigarettes allowed after midnight before surgery.  You may brush your teeth but be sure to rinse and spit.    Please shower with an over the counter antibacterial soap the evening before or the morning of surgery.    If your surgery is scheduled as an outpatient, you must arrange to have someone drive you home and have someone with you 24 hours after anesthesia.    If you stay in the hospital  after surgery, you will be evaluated daily by your care team for appropriateness for discharge. You will be responsible for coordinating your transportation and home support for your recovery. If you do not know the needs for post-surgical support, please address this prior to surgery with your surgeon or your surgeon's nurse so you will have a safe transition home.     ========================================================================    If you have any questions, please contact your provider's office at [phone].    For emergencies during evenings, nights, weekends, and holidays, contact [facility] operator and request they contact the on-call Urology Resident at [phone].    =========================================================================

## 2022-06-14 NOTE — Progress Notes
Patient presented to clinic for a urodynamic study. Timeout completed with patient. Procedure was explained and consent was signed. Patient tolerated procedure well. Patient was then transferred to exam room to discuss results with Dr. Thalia Bloodgood.

## 2022-06-17 ENCOUNTER — Encounter: Admit: 2022-06-17 | Discharge: 2022-06-17 | Payer: MEDICARE

## 2022-06-17 DIAGNOSIS — N3941 Urge incontinence: Secondary | ICD-10-CM

## 2022-06-17 MED ORDER — GENTAMICIN IVPB (CONVENTIONAL)
5 mg/kg | Freq: Once | INTRAVENOUS | 0 refills
Start: 2022-06-17 — End: ?

## 2022-06-17 MED ORDER — VANCOMYCIN 1G/250ML D5W IVPB (VIAL2BAG)
15 mg/kg | Freq: Once | INTRAVENOUS | 0 refills
Start: 2022-06-17 — End: ?

## 2022-06-20 ENCOUNTER — Encounter: Admit: 2022-06-20 | Discharge: 2022-06-20 | Payer: MEDICARE

## 2022-06-20 ENCOUNTER — Ambulatory Visit: Admit: 2022-06-20 | Discharge: 2022-06-20 | Payer: MEDICARE

## 2022-06-20 DIAGNOSIS — N390 Urinary tract infection, site not specified: Secondary | ICD-10-CM

## 2022-06-20 DIAGNOSIS — E785 Hyperlipidemia, unspecified: Secondary | ICD-10-CM

## 2022-06-20 DIAGNOSIS — N19 Unspecified kidney failure: Secondary | ICD-10-CM

## 2022-06-20 DIAGNOSIS — K5792 Diverticulitis of intestine, part unspecified, without perforation or abscess without bleeding: Secondary | ICD-10-CM

## 2022-06-20 DIAGNOSIS — C50919 Malignant neoplasm of unspecified site of unspecified female breast: Secondary | ICD-10-CM

## 2022-06-20 DIAGNOSIS — F32A Depression: Secondary | ICD-10-CM

## 2022-06-20 DIAGNOSIS — N3941 Urge incontinence: Secondary | ICD-10-CM

## 2022-06-20 DIAGNOSIS — R32 Unspecified urinary incontinence: Secondary | ICD-10-CM

## 2022-06-20 NOTE — Unmapped
[facility] Anesthesia Pre-Procedure Evaluation    Name: Tina Casey      MRN: [id number]     DOB: May 16, 1932     Age: 88 y.o.     Sex: female   _________________________________________________________________________     Procedure Info:   Procedure Information     Date/Time: 07/07/22 0940    Procedure: INSERTION NEUROSTIMULATOR SACRAL LEAD - STAGE 1 AXONICS - 1 hr, No long acting paralytic please    Location: MAIN OR 53 / Main OR/Periop    Surgeons: Britt Bolognese, MD          Physical Assessment  Vital Signs (last filed in past 24 hours):         Patient History   Allergies   Allergen Reactions   ? Ciprofloxacin UNKNOWN   ? Penicillins UNKNOWN        Current Medications    Medication Directions   acetaminophen SR(+) (TYLENOL) 650 mg tablet Take one tablet by mouth every 6 hours as needed for Pain.   aspirin EC 81 mg tablet Take one tablet by mouth daily. Take with food.   cetirizine (ZYRTEC) 10 mg tablet Take one tablet by mouth every morning.   coenzyme Q10 (CO Q-10) 100 mg capsule Take one capsule by mouth daily.   cranberry fruit extract (CRANBERRY PO) Take 1 capsule by mouth daily.   diclofenac sodium (VOLTAREN) 1 % topical gel Apply four g topically to affected area three times daily as needed (pain).   estradioL (ESTRACE) 0.01 % (0.1 mg/g) vaginal cream Insert or Apply  to vaginal area three times weekly. Insert pea-sized amount into vagina 3 nights a week.  Patient taking differently: Insert or Apply one g to vaginal area three times weekly. Insert pea-sized amount into vagina 3 nights a week.   famotidine (PEPCID) 20 mg tablet Take one tablet by mouth twice daily.   furosemide (LASIX) 20 mg tablet Take one tablet by mouth three times daily. Pt takes as needed   losartan (COZAAR) 25 mg tablet Take one tablet by mouth daily.   Magnesium Glycinate 100 mg tab Take two tablets by mouth daily.   mineral oil/petrolatum,white (LACRI-LUBE S.O.P. OP) Place 1 drop into or around eye(s) daily as needed. Multivitamins with Fluoride (MULTI-VITAMIN PO) Take 1 tablet by mouth daily.   pantoprazole DR (PROTONIX) 40 mg tablet Take one tablet by mouth daily.   potassium chloride (K-DUR 10) 10 mEq tablet Take one tablet by mouth daily with food.   sertraline (ZOLOFT) 50 mg tablet Take one tablet by mouth daily.  Patient not taking: Reported on 06/20/2022   traMADoL (ULTRAM) 50 mg tablet Take one tablet by mouth every 8 hours as needed for Pain.       Review of Systems/Medical History      Patient summary reviewed  Nursing notes reviewed    PONV Screening: Non-smoker and Female sex        Pulmonary          No recent URI      Cardiovascular         Exercise tolerance: >4 METS                No palpitations          No dyspnea on exertion      No syncope      GI/Hepatic/Renal             GERD (reflux), well controlled  Renal disease (CKD 3b): CKD Stage 3 (eGFR 30-59)           Neuro/Psych           Psychiatric history (In remission)          Depression (not current/remission)          Endocrine/Other             Malignancy (Breast):    treated         PHYSICAL EXAM     Diagnostic Tests  Hematology: No results found for: HGB, HCT, PLTCT, WBC, NEUT, ANC, LYMPH, ALC, ABSLYMPHCT, MONA, AMC, EOSA, ABC, BASOPHILS, MCV, MCH, MCHC, MPV, RDW      General Chemistry:   Lab Results   Component Value Date    NA 137 07/16/2018    K 3.9 07/16/2018    CL 103 07/16/2018    CO2 23 07/16/2018    GAP 11 07/16/2018    BUN 22 07/16/2018    CR 1.45 07/16/2018    GLU 98 07/16/2018    CA 9.5 07/16/2018      Coagulation: No results found for: PT, PTT, INR    PAC Plan    Interview: Phone Screen Interview                                        Alerts

## 2022-06-20 NOTE — Pre-Anesthesia Patient Instructions
PREPROCEDURE INFORMATION    Arrival at the hospital  [facility]  [address]  [place], [place] [address]    Park in the [employer], located directly across from the main entrance to the hospital.  Enter through the ground floor main hospital entrance and check in at the Information Desk in the lobby.  They will validate your parking ticket and direct you to the next location.  If you are a woman between the ages of 74 and 57, and have not had a hysterectomy, you will be asked for a urine sample prior to surgery.  Please do not urinate before arriving in the Surgery Waiting Room.  Once there, check in and let the attendant know if you need to provide a sample.    You will receive a call with your surgery arrival time between 2:30pm and 4:30pm the last business day before your procedure.  If you do not receive a call, please call [phone] before 4:30pm or [phone] after 4:30pm.    Eating or drinking before surgery  Do not eat anything after 11:00 the night before surgery.  This includes mints, candy, chewing tobacco, and gum.  You may have clear liquids up to 2 hours prior to surgery.    Clear Liquid Examples:   Water  Clear juice (Apple or cranberry), no pulp   Coffee with or without sugar (no cream)   Sports drinks - Powerade/Gatorade   [employer] transportation for outpatient procedure  For your safety, you will need to arrange for a responsible ride/person to accompany you home due to sedation or anesthesia with your procedure.  An Benedetto Goad, taxi or other public transportation driver is not considered a responsible person to accompany you home.    Bath/Shower Instructions  Take a bath or shower with antibacterial soap the night before and the morning of your procedure. Use clean towels.  Put on clean clothes after bath or shower.  Avoid using lotion and oils.  Sleep on clean sheets if bath or shower is done the night before procedure.    Morning of your procedure:  Brush your teeth and tongue  Do not smoke, vape, chew or user any tobacco products.  Do not shave the area where you will have surgery.  Remove nail polish, makeup and all jewelry (including piercings) before coming to the hospital.  Dress in clean, loose, comfortable clothing.    Valuables  Leave money, credit cards, jewelry, and any other valuables at home. [facility] is not responsible for the loss or breakage of personal items.    What to bring to the hospital  ID/Insurance card  Medical Device card  Official documents for legal guardianship  Copy of your Living Will, Advanced Directives, and/or Durable Power of Attorney. If you have these documents, please bring them to the admissions office on the day of your surgery to be scanned into your records.  Do not bring medications from home unless instructed by a pharmacist.  Dan Humphreys, cane, or motorized scooter  Cases for glasses/hearing aids/contact lens (bring solutions for contacts)     Notify us at [facility]: [phone] on the day of your procedure if:  You need to cancel your procedure.  You are going to be late.    Notify your surgeon if:  There is a possibility that you are pregnant.  You become ill with a cough, fever, sore throat, nausea, vomiting or flu-like symptoms.  You have any open wounds/sores that are red, painful, draining, or are new since you last saw the doctor.  You need to cancel your procedure.    Preparing to get your medications at discharge  Your surgeon may prescribe you medications to take after your procedure.  If you like the convenience of having your medications filled here at [facility], please do the following:  Go to [facility] after your [facility] appointment to put a credit card on file.  Call [facility] pharmacy at [phone] (Monday-Friday 7am-9pm or Saturday and Sunday 9am-5pm) to put a credit card on file.  Bring a credit card or cash on the day of your procedure- please leave with a family member rather than bringing it into the preop area.    Current Visitor Policy:  Visitors must be free of fever and symptoms to be in our facilities.  No more than 2 visitors per patient are allowed.  Additional guidelines may vary, based on patient care area or patient's condition.  Patients in semiprivate rooms may have visitors, but visits should be coordinated so only two total visitors are in a room at a time due to space limitations.  Children younger than age 46 are allowed to visit inpatients.    Thank you for participating in your Preoperative Assessment Clinic visit today.    If you have any changes to your health or hospitalizations between now and your surgery, please call us at [phone] for Main instructions.    Instructions given to patient via: MyChart and email

## 2022-06-20 NOTE — Pre-Anesthesia Medication Instructions
YOUR MEDICATION LIST     acetaminophen SR(+) (TYLENOL) 650 mg tablet Take one tablet by mouth every 6 hours as needed for Pain.    aspirin EC 81 mg tablet Take one tablet by mouth daily. Take with food.    cetirizine (ZYRTEC) 10 mg tablet Take one tablet by mouth every morning.    coenzyme Q10 (CO Q-10) 100 mg capsule Take one capsule by mouth daily.    cranberry fruit extract (CRANBERRY PO) Take 1 capsule by mouth daily.    diclofenac sodium (VOLTAREN) 1 % topical gel Apply four g topically to affected area three times daily as needed (pain).    estradioL (ESTRACE) 0.01 % (0.1 mg/g) vaginal cream Insert or Apply  to vaginal area three times weekly. Insert pea-sized amount into vagina 3 nights a week. (Patient taking differently: Insert or Apply one g to vaginal area three times weekly. Insert pea-sized amount into vagina 3 nights a week.)    famotidine (PEPCID) 20 mg tablet Take one tablet by mouth twice daily.    furosemide (LASIX) 20 mg tablet Take one tablet by mouth three times daily. Pt takes as needed    losartan (COZAAR) 25 mg tablet Take one tablet by mouth daily.    Magnesium Glycinate 100 mg tab Take two tablets by mouth daily.    mineral oil/petrolatum,white (LACRI-LUBE S.O.P. OP) Place 1 drop into or around eye(s) daily as needed.    Multivitamins with Fluoride (MULTI-VITAMIN PO) Take 1 tablet by mouth daily.    pantoprazole DR (PROTONIX) 40 mg tablet Take one tablet by mouth daily.    potassium chloride (K-DUR 10) 10 mEq tablet Take one tablet by mouth daily with food.    sertraline (ZOLOFT) 50 mg tablet Take one tablet by mouth daily. (Patient not taking: Reported on 06/20/2022)    traMADoL (ULTRAM) 50 mg tablet Take one tablet by mouth every 8 hours as needed for Pain.         YOUR  MEDICATION INSTRUCTIONS FOR SURGERY    Please continue taking your medications as your doctor has told you to UNLESS it is listed to stop below.    14 DAYS BEFORE SURGERY  Do  not take the following vitamins, herbals, and supplements:  Multivitamin  Cranberry fruit extract  Coenzyme Q10  Do not  start any new vitamins, herbals, or natural supplements before surgery.      7 DAYS BEFORE SURGERY  DO NOT TAKE the following anti-inflammatory medications such as ibuprofen (Advil, Motrin) and naproxen (Aleve)   You may use acetaminophen (Tylenol) - Diclofenac gel    BLOOD THINNER instructions:   Aspirin- 7 day hold before surgery     DAY BEFORE SURGERY  TAKE your medications the day before surgery as usual unless told differently.      MORNING OF SURGERY  DO NOT take these medications:  Remaining vitamins/supplements  Ointments/creams/lotions  Magnesium  Potassium Chloride  Losartan- (Cozaar)  Lasix (furosemide)     TAKE these medications with a sip (1-2 ounces) of water:  Cetirizine (zyrtec)  Famotidine (pepcid)  Pantoprazole (protonix)  Use all inhalers, nasal sprays, and eye drops as usual  May take if needed:  Tramadol  Tylenol     Please contact the clinic pharmacist with any changes to your medication list or medication questions before surgery.  E-mail:  [email or web address]   Phone: [phone]     Before going home from the hospital, please ask your doctor when you should re-start any medicine  that was stopped for surgery.    It was nice speaking with you today, here is a copy of the instructions we reviewed.  If you have questions or changes, please feel free to give me a call.    Thanks,    Jenny Reichmann, [facility]  Phone Triage  [phone]

## 2022-07-05 ENCOUNTER — Encounter: Admit: 2022-07-05 | Discharge: 2022-07-05 | Payer: MEDICARE

## 2022-07-05 NOTE — Telephone Encounter
LVM for Tina Casey to let her know that she will need to arrive at 9:15am on Thursday for her procedure. Also advised that someone from pre-op would reach out to her tomorrow afternoon to confirm this. Provided clinic phone number for call back purposes.

## 2022-07-07 ENCOUNTER — Ambulatory Visit: Admit: 2022-07-07 | Discharge: 2022-07-07 | Payer: MEDICARE

## 2022-07-07 ENCOUNTER — Encounter: Admit: 2022-07-07 | Discharge: 2022-07-07 | Payer: MEDICARE

## 2022-07-07 MED ORDER — FENTANYL CITRATE (PF) 50 MCG/ML IJ SOLN
INTRAVENOUS | 0 refills | Status: DC
Start: 2022-07-07 — End: 2022-07-07

## 2022-07-07 MED ORDER — ETOMIDATE 2 MG/ML IV SOLN
INTRAVENOUS | 0 refills | Status: DC
Start: 2022-07-07 — End: 2022-07-07

## 2022-07-07 MED ORDER — LIDOCAINE (PF) 200 MG/10 ML (2 %) IJ SYRG
INTRAVENOUS | 0 refills | Status: DC
Start: 2022-07-07 — End: 2022-07-07

## 2022-07-07 MED ORDER — ROCURONIUM 10 MG/ML IV SOLN
INTRAVENOUS | 0 refills | Status: DC
Start: 2022-07-07 — End: 2022-07-07

## 2022-07-07 MED ORDER — EPHEDRINE SULFATE 50 MG/ML IV SOLN
INTRAVENOUS | 0 refills | Status: DC
Start: 2022-07-07 — End: 2022-07-07

## 2022-07-07 MED ORDER — SODIUM CHLORIDE 0.9 % IV SOLP
INTRAVENOUS | 0 refills | Status: DC
Start: 2022-07-07 — End: 2022-07-07

## 2022-07-07 MED ORDER — PROPOFOL INJ 10 MG/ML IV VIAL
INTRAVENOUS | 0 refills | Status: DC
Start: 2022-07-07 — End: 2022-07-07

## 2022-07-07 MED ORDER — ARTIFICIAL TEARS (PF) SINGLE DOSE DROPS GROUP
OPHTHALMIC | 0 refills | Status: DC
Start: 2022-07-07 — End: 2022-07-07

## 2022-07-07 MED ORDER — SUGAMMADEX 100 MG/ML IV SOLN
INTRAVENOUS | 0 refills | Status: DC
Start: 2022-07-07 — End: 2022-07-07

## 2022-07-07 MED ADMIN — OXYCODONE 5 MG PO TAB [10814]: 5 mg | ORAL | @ 21:00:00 | Stop: 2022-07-07 | NDC 68084035411

## 2022-07-07 MED ADMIN — ACETAMINOPHEN 500 MG PO TAB [102]: 1000 mg | ORAL | @ 21:00:00 | Stop: 2022-07-07 | NDC 00904672080

## 2022-07-07 MED ADMIN — SODIUM CHLORIDE 0.9 % IV SOLP [27838]: 275 mg | INTRAVENOUS | @ 19:00:00 | Stop: 2022-07-07 | NDC 00338951772

## 2022-07-07 MED ADMIN — SODIUM CHLORIDE 0.9 % IV SOLP [27838]: 1000 mL | INTRAVENOUS | @ 18:00:00 | Stop: 2022-07-07 | NDC 00338004904

## 2022-07-07 MED ADMIN — GENTAMICIN 40 MG/ML IJ SOLN [3426]: 275 mg | INTRAVENOUS | @ 19:00:00 | Stop: 2022-07-07 | NDC 63323001003

## 2022-07-07 MED ADMIN — DEXTROSE 5% IN WATER IV SOLP [2364]: 1000 mg | INTRAVENOUS | @ 18:00:00 | Stop: 2022-07-07 | NDC 00338001702

## 2022-07-07 MED ADMIN — VANCOMYCIN 1,000 MG IV SOLR [8442]: 1000 mg | INTRAVENOUS | @ 18:00:00 | Stop: 2022-07-07 | NDC 70436002182

## 2022-07-07 NOTE — Anesthesia Post-Procedure Evaluation
Post-Anesthesia Evaluation    Name: Tina Casey      MRN: [id number]     DOB: January 09, 1932     Age: 88 y.o.     Sex: female   __________________________________________________________________________     Procedure Information     Anesthesia Start Date/Time: 07/07/22 1336    Procedure: INSERTION NEUROSTIMULATOR SACRAL LEAD - STAGE 1 AXONICS (Back) - 1 hr, No long acting paralytic please    Location: MAIN OR 53 / Main OR/Periop    Surgeons: Theadora Rama, MD          Post-Anesthesia Vitals  BP: 165/73 (10/12 1630)  Temp: 36.5 C (97.7 F) (10/12 1630)  Pulse: 75 (10/12 1630)  Respirations: 15 PER MINUTE (10/12 1630)  SpO2: 95 % (10/12 1630)  SpO2 Pulse: 75 (10/12 1630)  O2 Device: None (Room air) (10/12 1630)   Vitals Value Taken Time   BP 165/73 07/07/22 1630   Temp 36.5 C (97.7 F) 07/07/22 1630   Pulse 75 07/07/22 1630   Respirations 15 PER MINUTE 07/07/22 1630   SpO2 95 % 07/07/22 1630   O2 Device None (Room air) 07/07/22 1630   ABP     ART BP           Post Anesthesia Evaluation Note    Evaluation location: Pre/Post  Patient participation: recovered; patient participated in evaluation  Level of consciousness: alert  Pain management: adequate    Hydration: normovolemia  Temperature: 36.0C - 38.4C  Airway patency: adequate    Perioperative Events       Post-op nausea and vomiting: no PONV    Postoperative Status  Cardiovascular status: hemodynamically stable  Respiratory status: spontaneous ventilation  Follow-up needed: none  Additional comments: Patient evaluated in PACU. Denies nausea, vomiting, or shortness of breath. [place] for transport home w/ friend.         Perioperative Events  There were no known notable events for this encounter.

## 2022-07-07 NOTE — Anesthesia Procedure Notes
Procedure: Airway Placement    AIRWAY INSERTION    Date/Time: 07/07/2022 1:58 PM    Patient location: OR  Urgency: elective  Difficult Airway: No            Airway Procedure  Indication(s) for airway management: surgery        no    Preoxygenated: yes  Patient position: sniffing    Mask difficulty assessment: 1 - vent by mask      Procedure Outcome  Final airway type: endotracheal airway  Endotracheal airway: ETT          ETT size (mm): 7.0  Technique used for successful ETT placement: direct laryngoscopy  Devices/methods used in placement: intubating stylet  Insertion site: oral  Blade type: Macintosh   Laryngoscope/Videolaryngoscope blade size: 3  Cormack-Lehane classification: grade I - full view of glottis      Measured from: lips  Number of attempts at approach: 1  Placement verified by auscultation and capnometry            Complications  Cardiovascular:   Pulmonary:   Procedure: airway not difficult  Medication:         Performed by: Sherle Poe, MD  Authorized by: Sherle Poe, MD

## 2022-07-09 ENCOUNTER — Encounter: Admit: 2022-07-09 | Discharge: 2022-07-09 | Payer: MEDICARE

## 2022-07-09 DIAGNOSIS — R32 Unspecified urinary incontinence: Secondary | ICD-10-CM

## 2022-07-09 DIAGNOSIS — N390 Urinary tract infection, site not specified: Secondary | ICD-10-CM

## 2022-07-09 DIAGNOSIS — C50919 Malignant neoplasm of unspecified site of unspecified female breast: Secondary | ICD-10-CM

## 2022-07-09 DIAGNOSIS — E785 Hyperlipidemia, unspecified: Secondary | ICD-10-CM

## 2022-07-09 DIAGNOSIS — K5792 Diverticulitis of intestine, part unspecified, without perforation or abscess without bleeding: Secondary | ICD-10-CM

## 2022-07-09 DIAGNOSIS — F32A Depression: Secondary | ICD-10-CM

## 2022-07-09 DIAGNOSIS — N19 Unspecified kidney failure: Secondary | ICD-10-CM

## 2022-07-13 ENCOUNTER — Encounter: Admit: 2022-07-13 | Discharge: 2022-07-13 | Payer: MEDICARE

## 2022-07-14 ENCOUNTER — Encounter: Admit: 2022-07-14 | Discharge: 2022-07-14 | Payer: MEDICARE

## 2022-07-14 ENCOUNTER — Ambulatory Visit: Admit: 2022-07-14 | Discharge: 2022-07-14 | Payer: MEDICARE

## 2022-07-14 MED ORDER — ONDANSETRON HCL (PF) 4 MG/2 ML IJ SOLN
INTRAVENOUS | 0 refills | Status: DC
Start: 2022-07-14 — End: 2022-07-14

## 2022-07-14 MED ORDER — PROPOFOL 10 MG/ML IV EMUL 20 ML (INFUSION)(AM)(OR)
INTRAVENOUS | 0 refills | Status: DC
Start: 2022-07-14 — End: 2022-07-14
  Administered 2022-07-14: 16:00:00 50 ug/kg/min via INTRAVENOUS

## 2022-07-14 MED ORDER — LIDOCAINE (PF) 20 MG/ML (2 %) IJ SOLN
INTRAVENOUS | 0 refills | Status: DC
Start: 2022-07-14 — End: 2022-07-14

## 2022-07-14 MED ORDER — FENTANYL CITRATE (PF) 50 MCG/ML IJ SOLN
INTRAVENOUS | 0 refills | Status: DC
Start: 2022-07-14 — End: 2022-07-14

## 2022-07-14 MED ORDER — PROPOFOL INJ 10 MG/ML IV VIAL
INTRAVENOUS | 0 refills | Status: DC
Start: 2022-07-14 — End: 2022-07-14

## 2022-07-14 MED ORDER — ALBUMIN, HUMAN 5 % 250 ML IV SOLP (AN)(OSM)
INTRAVENOUS | 0 refills | Status: DC
Start: 2022-07-14 — End: 2022-07-14

## 2022-07-14 MED ADMIN — ACETAMINOPHEN 500 MG PO TAB [102]: 1000 mg | ORAL | @ 17:00:00 | Stop: 2022-07-14 | NDC 00904672080

## 2022-07-14 MED ADMIN — POLYMYXIN B SULFATE 500,000 UNIT IJ SOLR [6393]: 1000 mL | @ 17:00:00 | Stop: 2022-07-14 | NDC 63323036701

## 2022-07-14 MED ADMIN — LIDOCAINE HCL 10 MG/ML (1 %) IJ SOLN [4452]: 9 mL | INTRAMUSCULAR | @ 17:00:00 | Stop: 2022-07-14 | NDC 00409427617

## 2022-07-14 MED ADMIN — NEOMYCIN-POLYMYXIN B GU 40 MG-200,000 UNIT/ML IR SOLN [82715]: 1000 mL | @ 17:00:00 | Stop: 2022-07-14 | NDC 39822120101

## 2022-07-14 MED ADMIN — SODIUM BICARBONATE 1 MEQ/ML (8.4 %) IV SOLN [7309]: 9 mL | INTRAMUSCULAR | @ 17:00:00 | Stop: 2022-07-14 | NDC 51754501101

## 2022-07-14 MED ADMIN — DEXTROSE 5% IN WATER IV SOLP [2364]: 1000 mg | INTRAVENOUS | @ 16:00:00 | Stop: 2022-07-14 | NDC 00338001702

## 2022-07-14 MED ADMIN — WATER FOR IRRIGATION, STERILE IR SOLN [7485]: 1000 mL | @ 17:00:00 | Stop: 2022-07-14 | NDC 00338000404

## 2022-07-14 MED ADMIN — VANCOMYCIN 1,000 MG IV SOLR [8442]: 1000 mg | INTRAVENOUS | @ 16:00:00 | Stop: 2022-07-14 | NDC 00409653301

## 2022-07-14 MED ADMIN — SODIUM CHLORIDE 0.9 % IV SOLP [27838]: 1000.000 mL | INTRAVENOUS | @ 16:00:00 | Stop: 2022-07-14 | NDC 00338004904

## 2022-07-14 MED ADMIN — GENTAMICIN 40 MG/ML IJ SOLN [3426]: 350 mg | INTRAVENOUS | @ 16:00:00 | Stop: 2022-07-14 | NDC 63323001003

## 2022-07-14 MED ADMIN — SODIUM CHLORIDE 0.9 % IV SOLP [27838]: 350 mg | INTRAVENOUS | @ 16:00:00 | Stop: 2022-07-14 | NDC 00338951772

## 2022-07-14 NOTE — Anesthesia Pre-Procedure Evaluation
Anesthesia Pre-Procedure Evaluation    Name: Tina Casey      MRN: [id number]     DOB: 1931-10-15     Age: 88 y.o.     Sex: female   _________________________________________________________________________     Procedure Info:   Procedure Information     Date/Time: 07/14/22 1100    Procedure: INSERTION NEUROSTIMULATOR PULSE GENERATOR- STAGE 2 NON-RECHARGEABLE - 30 min    Location: MAIN OR 53 / Main OR/Periop    Surgeons: Britt Bolognese, MD          Physical Assessment  Vital Signs (last filed in past 24 hours):         Patient History   Allergies   Allergen Reactions   ? Ciprofloxacin UNKNOWN   ? Penicillins UNKNOWN        Current Medications    Medication Directions   acetaminophen SR(+) (TYLENOL) 650 mg tablet Take one tablet by mouth every 6 hours as needed for Pain.   aspirin EC 81 mg tablet Take one tablet by mouth daily. Take with food.   cetirizine (ZYRTEC) 10 mg tablet Take one tablet by mouth every morning.   coenzyme Q10 (CO Q-10) 100 mg capsule Take one capsule by mouth daily.   cranberry fruit extract (CRANBERRY PO) Take 1 capsule by mouth daily.   diclofenac sodium (VOLTAREN) 1 % topical gel Apply four g topically to affected area three times daily as needed (pain).   estradioL (ESTRACE) 0.01 % (0.1 mg/g) vaginal cream Insert or Apply  to vaginal area three times weekly. Insert pea-sized amount into vagina 3 nights a week.  Patient taking differently: Insert or Apply one g to vaginal area three times weekly. Insert pea-sized amount into vagina 3 nights a week.   famotidine (PEPCID) 20 mg tablet Take one tablet by mouth twice daily.   furosemide (LASIX) 20 mg tablet Take one tablet by mouth three times daily. Pt takes as needed   losartan (COZAAR) 25 mg tablet Take one tablet by mouth daily.   Magnesium Glycinate 100 mg tab Take two tablets by mouth daily.   mineral oil/petrolatum,white (LACRI-LUBE S.O.P. OP) Place 1 drop into or around eye(s) daily as needed.   Multivitamins with Fluoride (MULTI-VITAMIN PO) Take 1 tablet by mouth daily.   oxyCODONE (ROXICODONE) 5 mg tablet Take one tablet by mouth every 6 hours as needed for Pain.   pantoprazole DR (PROTONIX) 40 mg tablet Take one tablet by mouth daily.   potassium chloride (K-DUR 10) 10 mEq tablet Take one tablet by mouth daily with food.   sennosides-docusate sodium (SENNA PLUS) 8.6/50 mg tablet Take one tablet by mouth daily. Use with narcotic. Hold for loose stools   sertraline (ZOLOFT) 50 mg tablet Take one tablet by mouth daily.   traMADoL (ULTRAM) 50 mg tablet Take one tablet by mouth every 8 hours as needed for Pain.       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)           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      Neuro/Psych           Psychiatric history (In remission)          Depression (not current/remission)          Endocrine/Other             Malignancy (Breast):    treated      Constitution - negative       PHYSICAL EXAM     Previous Airway Procedure Notes Displaying the 3 most recent records    Date Mask Difficulty Techninque used for succesful ETT placement Blade Type Blade Size View with successful placement Number of attempts    07/07/22 1 - vent by mask direct laryngoscopy Macintosh 3 grade I - full view of glottis 1          Patient Lines/Drains/Airways Status     Active Lines:     None              Diagnostic Tests  Hematology: No results found for: HGB, HCT, PLTCT, WBC, NEUT, ANC, LYMPH, ALC, ABSLYMPHCT, MONA, AMC, EOSA, ABC, BASOPHILS, MCV, MCH, MCHC, MPV, RDW      General Chemistry:   Lab Results   Component Value Date    NA 137 07/16/2018    K 3.9 07/16/2018    CL 103 07/16/2018    CO2 23 07/16/2018    GAP 11 07/16/2018    BUN 22 07/16/2018    CR 1.45 07/16/2018    GLU 98 07/16/2018    CA 9.5 07/16/2018      Coagulation: No results found for: PT, PTT, INR    PAC Plan    Anesthesia Plan    ASA score: 3   Plan: general        Alerts

## 2022-07-16 ENCOUNTER — Encounter: Admit: 2022-07-16 | Discharge: 2022-07-16 | Payer: MEDICARE

## 2022-07-16 DIAGNOSIS — C50919 Malignant neoplasm of unspecified site of unspecified female breast: Secondary | ICD-10-CM

## 2022-07-16 DIAGNOSIS — N390 Urinary tract infection, site not specified: Secondary | ICD-10-CM

## 2022-07-16 DIAGNOSIS — R32 Unspecified urinary incontinence: Secondary | ICD-10-CM

## 2022-07-16 DIAGNOSIS — F32A Depression: Secondary | ICD-10-CM

## 2022-07-16 DIAGNOSIS — E785 Hyperlipidemia, unspecified: Secondary | ICD-10-CM

## 2022-07-16 DIAGNOSIS — N19 Unspecified kidney failure: Secondary | ICD-10-CM

## 2022-07-16 DIAGNOSIS — K5792 Diverticulitis of intestine, part unspecified, without perforation or abscess without bleeding: Secondary | ICD-10-CM

## 2022-08-16 ENCOUNTER — Encounter: Admit: 2022-08-16 | Discharge: 2022-08-16 | Payer: MEDICARE

## 2022-08-26 ENCOUNTER — Encounter: Admit: 2022-08-26 | Discharge: 2022-08-26 | Payer: MEDICARE

## 2022-08-26 DIAGNOSIS — C50919 Malignant neoplasm of unspecified site of unspecified female breast: Secondary | ICD-10-CM

## 2022-08-26 DIAGNOSIS — E785 Hyperlipidemia, unspecified: Secondary | ICD-10-CM

## 2022-08-26 DIAGNOSIS — F32A Depression: Secondary | ICD-10-CM

## 2022-08-26 DIAGNOSIS — N19 Unspecified kidney failure: Secondary | ICD-10-CM

## 2022-08-26 DIAGNOSIS — N3941 Urge incontinence: Secondary | ICD-10-CM

## 2022-08-26 DIAGNOSIS — R32 Unspecified urinary incontinence: Secondary | ICD-10-CM

## 2022-08-26 DIAGNOSIS — K5792 Diverticulitis of intestine, part unspecified, without perforation or abscess without bleeding: Secondary | ICD-10-CM

## 2022-08-26 DIAGNOSIS — N390 Urinary tract infection, site not specified: Secondary | ICD-10-CM

## 2022-08-26 NOTE — Progress Notes
Urology    CC:  Post Operative Visit      HPI:  Tina Casey is a 88 y.o. female with PMH significant for breast cancer,?s/p?hysterectomy?who has?mixed urinary incontinence.?  ?Urethral bulking improved her incontinence in 2011.?She has tried oxybutynin as well as mirabegron without any improvement in her symptoms. Her?brother is a urologist.  ?  12/29/17 vag atrophy, approx 1cm cystic lesion tender to palpation anterior vaginal wall?that seems to be peri-urethral at the mid urethra, palpation of the lesion does not express any fluid from the meatus, neg cough stress test?  ?  Imaging:??Pelvic MRI- no vaginal wall cysts, no urethral diverticulum, mild inflammation from sigmoid colon to superior bladder without?evidence of fistula  ?  02/02/2018,?no change in her symptoms. She presents for UDS and cystoscopy.  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  06/2018- macroplastique urethral bulking  ?  12/04/2020,?someday the urinary incontinence is good and other days it is worse. She is not sure if the pessary is helping.The sister that helps with her pessary sometimes is retiring from providing healthcare. She has been started on daily macrobid by her PCP. She stopped the levsin because did not feel it was helping.  ?  07/30/21 botox 100 units  ?  11/19/2021,?primarily bothered by nocturnal enuresis. She did not notice an appreciable improvement after the botox other than for about 6 weeks she had difficulty emptying her bladder. She has now moved to nursing home as the bathroom is closer for her in these rooms.  She is on lasix 3 days per week.  ?  12/24/2021,?Just returned on a trip from [place].  Leak some days more than others. Using two pads and a depends at night. Would prefer not to live with?the urinary incontinence.  ?  Plan: Have not identified SUI since her bulking.?  ?  Primary bother is nocturia and nocturnal enuresis.?Would like to continue with how she is doing.   We discussed SNM. She does not want surgery.?Has already tried multiple meds and botox.  We discussed indwelling catheter- not interseted.  She will call to make a follow up appointment if things change.    07/14/22- axonics placement    Today, 08/26/2022, doing well after Axonics placement. Decreased frequency of urinary incontinence episodes. 1 pad per day and 1-2 pads over night.      PMH:  Medical History:   Diagnosis Date   ? Breast cancer (HCC)    ? Depression    ? Diverticulitis    ? Dyslipidemia    ? Incontinence    ? Renal failure     CKD 3b   ? UTI (urinary tract infection)         PSH:  Surgical History:   Procedure Laterality Date   ? ENDOSCOPIC INJECTION IMPLANT MATERIAL INTO URETHRA/ BLADDER NECK (MACROPLASTIQUE) N/A 07/16/2018    Performed by Venetia Maxon, MD at [facility] OR   ? INSERTION NEUROSTIMULATOR SACRAL LEAD - STAGE 1 AXONICS N/A 07/07/2022    Performed by Britt Bolognese, MD at [facility] OR   ? INSERTION NEUROSTIMULATOR PULSE GENERATOR- STAGE 2 NON-RECHARGEABLE N/A 07/14/2022    Performed by Britt Bolognese, MD at [facility] OR   ? CATARACT REMOVAL     ? HX APPENDECTOMY     ? HX CHOLECYSTECTOMY     ? HX HYSTERECTOMY     ? JOINT REPLACEMENT     ?  MASTECTOMY     ? TONSILLECTOMY         Medications:  Current Outpatient Medications   Medication Sig Dispense Refill   ? acetaminophen SR(+) (TYLENOL) 650 mg tablet Take one tablet by mouth every 6 hours as needed for Pain. 30 tablet    ? aspirin EC 81 mg tablet Take one tablet by mouth daily. Take with food.     ? cetirizine (ZYRTEC) 10 mg tablet Take one tablet by mouth every morning.     ? coenzyme Q10 (CO Q-10) 100 mg capsule Take one capsule by mouth daily.     ? cranberry fruit extract (CRANBERRY PO) Take 1 capsule by mouth daily.     ? diclofenac sodium (VOLTAREN) 1 % topical gel Apply four g topically to affected area three times daily as needed (pain).     ? estradioL (ESTRACE) 0.01 % (0.1 mg/g) vaginal cream Insert or Apply  to vaginal area three times weekly. Insert pea-sized amount into vagina 3 nights a week. 42.5 g 3   ? famotidine (PEPCID) 20 mg tablet Take one tablet by mouth twice daily.     ? furosemide (LASIX) 20 mg tablet Take one tablet by mouth three times daily. Pt takes as needed     ? losartan (COZAAR) 25 mg tablet Take one tablet by mouth daily.     ? Magnesium Glycinate 100 mg tab Take two tablets by mouth daily.     ? mineral oil/petrolatum,white (LACRI-LUBE S.O.P. OP) Place 1 drop into or around eye(s) daily as needed.     ? Multivitamins with Fluoride (MULTI-VITAMIN PO) Take 1 tablet by mouth daily.     ? oxyCODONE (ROXICODONE) 5 mg tablet Take one tablet by mouth every 6 hours as needed for Pain. 5 tablet 0   ? pantoprazole DR (PROTONIX) 40 mg tablet Take one tablet by mouth daily.  3   ? potassium chloride (K-DUR 10) 10 mEq tablet Take one tablet by mouth daily with food.     ? sennosides-docusate sodium (SENNA PLUS) 8.6/50 mg tablet Take one tablet by mouth daily. Use with narcotic. Hold for loose stools 30 tablet 0   ? sertraline (ZOLOFT) 50 mg tablet Take one tablet by mouth daily.  3   ? traMADoL (ULTRAM) 50 mg tablet Take one tablet by mouth every 8 hours as needed for Pain.         Allergies:  Ciprofloxacin and Penicillins    Social History:  Social History     Substance and Sexual Activity   Alcohol Use Yes   ? Alcohol/week: 1.0 standard drink of alcohol   ? Types: 1 Shots of liquor per week      Social History     Tobacco Use   Smoking Status Never   Smokeless Tobacco Never      Social History     Substance and Sexual Activity   Drug Use Never       Family History:  Family History   Problem Relation Age of Onset   ? Cancer Other    ? Diabetes Other    ? Heart Disease Other    ? Stroke Other        Physical Exam:  Vitals:    08/26/22 0825   BP: (!) 177/67   Pulse: 71   Temp: 36.8 ?C (98.3 ?F)   Resp: 18   SpO2: 98%       Gen: well-developed, NAD  HEENT: normocephalic, conjuctiva clear, mucous  membranes moist  Respiratory: normal respiratory effort with symmetric chest expansion  Skin: Warm, No jaundice  Musculoskeletal: No edema, no gross deformity  Neuro: Awake, alert, no focal deficits, normal gait  Psychiatric: Normal affect    Incisions- well healed      Diagnostics:        Labs:   Creatinine   Date Value Ref Range Status   07/16/2018 1.45 (H) 0.4 - 1.00 MG/DL Final     Culture   Date Value Ref Range Status   07/06/2018 >100,000 organisms/ml  ESCHERICHIA COLI   (A)  Final       Assessment/Plan:        It is challenging for her to travel here. Discussed we could do further management when possible virtually and I could refer her to urologist in [place]. [place] if she needs to be seen.  She was agreeable to this plan.     1. Urge incontinence of urine            Return if symptoms worsen or fail to improve.        Britt Bolognese, MD

## 2023-05-08 ENCOUNTER — Encounter: Admit: 2023-05-08 | Discharge: 2023-05-08 | Payer: MEDICARE

## 2024-01-02 ENCOUNTER — Encounter: Admit: 2024-01-02 | Discharge: 2024-01-02

## 2024-01-18 ENCOUNTER — Encounter: Admit: 2024-01-18 | Discharge: 2024-01-18 | Payer: MEDICARE

## 2024-01-30 ENCOUNTER — Encounter: Admit: 2024-01-30 | Discharge: 2024-01-30 | Payer: MEDICARE

## 2024-02-02 ENCOUNTER — Encounter: Admit: 2024-02-02 | Discharge: 2024-02-02 | Payer: MEDICARE

## 2024-02-02 DIAGNOSIS — N3941 Urge incontinence: Secondary | ICD-10-CM

## 2024-02-02 NOTE — Progress Notes
 Urology    CC:  Follow Up      HPI:  Tina Casey is a 89 y.o. female with PMH significant for breast cancer, s/p hysterectomy who has mixed urinary incontinence.    Urethral bulking improved her incontinence in 2011. She has tried oxybutynin as well as mirabegron without any improvement in her symptoms. Her brother is a urologist.     12/29/17 vag atrophy, approx 1cm cystic lesion tender to palpation anterior vaginal wall that seems to be peri-urethral at the mid urethra, palpation of the lesion does not express any fluid from the meatus, neg cough stress test      Imaging:  Pelvic MRI- no vaginal wall cysts, no urethral diverticulum, mild inflammation from sigmoid colon to superior bladder without evidence of fistula     02/02/2018, no change in her symptoms. She presents for UDS and cystoscopy.  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 She is not sure if the pessary is helping.The sister that helps with her pessary sometimes is retiring from providing healthcare. She has been started on daily macrobid by her PCP. She stopped the levsin because did not feel it was helping.     07/30/21 botox 100 units     11/19/2021, primarily bothered by nocturnal enuresis. She did not notice an appreciable improvement after the botox other than for about 6 weeks she had difficulty emptying her bladder. She has now moved to nursing home as the bathroom is closer for her in these rooms.  She is on lasix 3 days per week.     12/24/2021, Just returned on a trip from [place].  Leak some days more than others. Using two pads and a depends at night. Would prefer not to live with the urinary incontinence.     Plan: Have not identified SUI since her bulking. Primary bother is nocturia and nocturnal enuresis. Would like to continue with how she is doing.   We discussed SNM. She does not want surgery. Has already tried multiple meds and botox.  We discussed indwelling catheter- not interseted.  She will call to make a follow up appointment if things change.     07/14/22- axonics placement     08/26/2022, doing well after Axonics placement. Decreased frequency of urinary incontinence episodes. 1 pad per day and 1-2 pads over night.     Today, 02/02/2024, The patient presents with urinary incontinence and a groin rash.    She experiences several large leaks of urine throughout the day, necessitating the use of larger pads, sizes six and seven. She voids approximately every couple of hours and sometimes leaks urine when coughing or sneezing. The most significant issue is the sudden, large volume of urine loss without warning. At night, she may need to change her pad after four hours, often without being aware of the leakage during sleep. She also experiences leakage when transitioning from kneeling to standing during prayers.    She has developed a rash in the groin area over the past couple of months. Initially, she was prescribed an unspecified treatment, but she is currently using A and D ointment, which has led to improvement. The rash is associated with some swelling and irritation, particularly when the pad is wet,  but it does not itch. She notices the irritation especially during washing.    No recent urinary tract infections.               PMH:  Past Medical History:    Accidental fall    Breast cancer (CMS-HCC)    Depression    Depressive disorder, not elsewhere classified    Diverticulitis    Dyslipidemia    Hearing reduced    Incontinence    Renal failure    UTI (urinary tract infection)    Vision decreased        PSH:  Surgical History:   Procedure Laterality Date    ENDOSCOPIC INJECTION IMPLANT MATERIAL INTO URETHRA/ BLADDER NECK (MACROPLASTIQUE) N/A 07/16/2018 Performed by Delora Ferry, MD at [facility] OR    INSERTION NEUROSTIMULATOR SACRAL LEAD - STAGE 1 AXONICS N/A 07/07/2022    Performed by Huey Madrid, MD at [facility] OR    INSERTION NEUROSTIMULATOR PULSE GENERATOR- STAGE 2 NON-RECHARGEABLE N/A 07/14/2022    Performed by Huey Madrid, MD at [facility] OR    BREAST SURGERY  2015    Left breast masectomy    CATARACT REMOVAL      HX APPENDECTOMY  1949    HX CHOLECYSTECTOMY      HX HYSTERECTOMY      JOINT REPLACEMENT      MASTECTOMY      TONSILLECTOMY         Medications:  Current Outpatient Medications   Medication Sig Dispense Refill    cetirizine (ZYRTEC) 10 mg tablet Take one tablet by mouth every morning.      coenzyme Q10 (CO Q-10) 100 mg capsule Take one capsule by mouth daily.      cranberry fruit extract (CRANBERRY PO) Take 1 capsule by mouth daily.      diclofenac sodium (VOLTAREN) 1 % topical gel Apply four g topically to affected area three times daily as needed (pain).      losartan (COZAAR) 25 mg tablet Take one tablet by mouth daily.      Magnesium Glycinate 100 mg tab Take two tablets by mouth daily.      mineral oil/petrolatum,white (LACRI-LUBE S.O.P. OP) Place 1 drop into or around eye(s) daily as needed.      Multivitamins with Fluoride (MULTI-VITAMIN PO) Take 1 tablet by mouth daily.      pantoprazole DR (PROTONIX) 40 mg tablet Take one tablet by mouth daily.  3    potassium chloride (K-DUR 10) 10 mEq tablet Take one tablet by mouth daily with food.      sennosides-docusate sodium (SENNA PLUS) 8.6/50 mg tablet Take one tablet by mouth daily. Use with narcotic. Hold for loose stools 30 tablet 0    sertraline (ZOLOFT) 50 mg tablet Take one tablet by mouth daily.  3       Allergies:  Ciprofloxacin and Penicillins    Social History:  Social History     Substance and Sexual Activity   Alcohol Use Yes    Alcohol/week: 1.0 standard drink of alcohol    Types: 1 Shots of liquor per week    Comment: Drink bourbon occasionally      Social History     Tobacco Use Smoking Status Never   Smokeless Tobacco Never      Social History     Substance and Sexual Activity   Drug Use Never       Family History:  Family History   Problem Relation Name Age of  Onset    Cancer Other      Diabetes Other          45    Heart Disease Other      Stroke Other      Diabetes Mother Spirito         15       Physical Exam:  Vitals:    02/02/24 1141   BP: (!) 177/66   Pulse: 66   Temp: 36.7 ?C (98 ?F)   Resp: 18   SpO2: 100%       Gen: well-developed, NAD  HEENT: normocephalic, conjuctiva clear, mucous membranes moist  Respiratory: normal respiratory effort with symmetric chest expansion  Skin: Warm, No jaundice  Musculoskeletal: No edema, no gross deformity  Neuro: Awake, alert, no focal deficits, normal gait  Psychiatric: Normal affect  GU:  normal appearing external genitalia, urethral meatus normal in location, no urethral mucosal prolapse  Vaginal epithelium atrophic  No evidence of rash                       Diagnostics:    Labs:   Lab Results   Component Value Date    CR 1.45 (H) 07/16/2018     Lab Results   Component Value Date    CULTURE >100,000 organisms/ml  ESCHERICHIA COLI   (A) 07/06/2018                        Assessment/Plan:        She met with Harlow Lighter from [place] and was re-programmed.   Follow up with me as needed                1. Urgency incontinence                Huey Madrid, MD

## 2024-02-05 ENCOUNTER — Encounter: Admit: 2024-02-05 | Discharge: 2024-02-05 | Payer: MEDICARE

## 2024-04-11 ENCOUNTER — Encounter: Admit: 2024-04-11 | Discharge: 2024-04-11 | Payer: MEDICARE

## 2024-04-12 ENCOUNTER — Encounter: Admit: 2024-04-12 | Discharge: 2024-04-12 | Payer: MEDICARE

## 2024-04-12 NOTE — Telephone Encounter
 Received message from Lauraine at the [facility] who reports that Tina Casey is having urinary leakage and wearing pads. This is causing her some labia irritation/chafing and they are wondering if Dr Melissa has any recommendations.  Returned call, no answer. LVM letting her know that Dr Melissa recommends changing pads regularly to keep skin as dry as possible. Also advised to use aquaphor on labia to help sooth and protect skin. Clinic number provided for call back purposes.

## 2024-08-13 ENCOUNTER — Encounter: Admit: 2024-08-13 | Discharge: 2024-08-13 | Payer: MEDICARE

## 2024-08-15 ENCOUNTER — Encounter: Admit: 2024-08-15 | Discharge: 2024-08-15 | Payer: MEDICARE

## 2024-08-16 ENCOUNTER — Encounter: Admit: 2024-08-16 | Discharge: 2024-08-16 | Payer: MEDICARE

## 2024-08-16 ENCOUNTER — Ambulatory Visit: Admit: 2024-08-16 | Discharge: 2024-08-17 | Payer: MEDICARE

## 2024-08-16 VITALS — BP 156/79 | HR 64 | Temp 97.90000°F | Wt 152.4 lb

## 2024-08-16 DIAGNOSIS — N3946 Mixed incontinence: Principal | ICD-10-CM

## 2024-08-16 NOTE — Progress Notes [1]
 Urology    CC:  Follow Up      HPI:  Tina Casey is a 89 y.o. female with PMH significant for breast cancer, s/p hysterectomy who has mixed urinary incontinence.    Urethral bulking improved her incontinence in 2011. She has tried oxybutynin  as well as mirabegron  without any improvement in her symptoms. Her brother is a urologist.     12/29/17 vag atrophy, approx 1cm cystic lesion tender to palpation anterior vaginal wall that seems to be peri-urethral at the mid urethra, palpation of the lesion does not express any fluid from the meatus, neg cough stress test      Imaging:  Pelvic MRI- no vaginal wall cysts, no urethral diverticulum, mild inflammation from sigmoid colon to superior bladder without evidence of fistula     02/02/2018, no change in her symptoms. She presents for UDS and cystoscopy.  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 She is not sure if the pessary is helping.The sister that helps with her pessary sometimes is retiring from providing healthcare. She has been started on daily macrobid by her PCP. She stopped the levsin  because did not feel it was helping.     07/30/21 botox  100 units     11/19/2021, primarily bothered by nocturnal enuresis. She did not notice an appreciable improvement after the botox  other than for about 6 weeks she had difficulty emptying her bladder. She has now moved to nursing home as the bathroom is closer for her in these rooms.  She is on lasix 3 days per week.     12/24/2021, Just returned on a trip from [place].  Leak some days more than others. Using two pads and a depends at night. Would prefer not to live with the urinary incontinence.     Plan: Have not identified SUI since her bulking. Primary bother is nocturia and nocturnal enuresis. Would like to continue with how she is doing.   We discussed SNM. She does not want surgery. Has already tried multiple meds and botox .  We discussed indwelling catheter- not interseted.  She will call to make a follow up appointment if things change.     07/14/22- axonics placement     08/26/2022, doing well after Axonics placement. Decreased frequency of urinary incontinence episodes. 1 pad per day and 1-2 pads over night.     02/02/2024, She experiences several large leaks of urine throughout the day, necessitating the use of larger pads, sizes six and seven. She voids approximately every couple of hours and sometimes leaks urine when coughing or sneezing. The most significant issue is the sudden, large volume of urine loss without warning. At night, she may need to change her pad after four hours, often without being aware of the leakage during sleep. She also experiences leakage when transitioning from kneeling to standing during prayers.     She has developed a rash in the groin area over the past couple of months. Initially, she was prescribed an unspecified treatment, but she is currently using A and D ointment, which has led to improvement. The rash is associated with some swelling and irritation, particularly when the pad is wet, but it does not itch. She notices the irritation especially during washing.  No recent urinary tract infections.        Today, 08/16/2024,   History of Present Illness    She experiences urinary incontinence primarily at nighttime and when transitioning from kneeling to standing. The incontinence also occurs when standing up after sitting in the chapel.    She has a history of urinary tract infections but has not had one in a few years.    She previously had a rash in her groin area, which resolved with the use of A and D ointment. She mentions feeling 'bigger down there' but attributes it to weight gain.        PMH:  Past Medical History:    Accidental fall    Breast cancer (CMS-HCC)    Depression    Depressive disorder, not elsewhere classified    Diverticulitis    Dyslipidemia    Hearing reduced    Incontinence    Renal failure    UTI (urinary tract infection)    Vision decreased        PSH:  Surgical History:   Procedure Laterality Date    ENDOSCOPIC INJECTION IMPLANT MATERIAL INTO URETHRA/ BLADDER NECK (MACROPLASTIQUE) N/A 07/16/2018    Performed by Melissa Duncan, MD at [facility] OR    INSERTION NEUROSTIMULATOR SACRAL LEAD - STAGE 1 AXONICS N/A 07/07/2022    Performed by Melissa Duncan MATSU, MD at [facility] OR    INSERTION NEUROSTIMULATOR PULSE GENERATOR- STAGE 2 NON-RECHARGEABLE N/A 07/14/2022    Performed by Melissa Duncan MATSU, MD at [facility] OR    BREAST SURGERY  2015    Left breast masectomy    CATARACT REMOVAL      HX APPENDECTOMY  1949    HX CHOLECYSTECTOMY      HX HYSTERECTOMY  1999    JOINT REPLACEMENT      MASTECTOMY      TONSILLECTOMY         Medications:  Current Outpatient Medications   Medication Sig Dispense Refill    calcium carbonate (TUMS) 500 mg (200 mg elemental calcium) chewable tablet Chew one tablet by mouth daily as needed.      cetirizine (ZYRTEC) 10 mg tablet Take one tablet by mouth every morning.      coenzyme Q10 (CO Q-10) 100 mg capsule Take one capsule by mouth daily. (Patient not taking: Reported on 02/02/2024)      cranberry fruit extract (CRANBERRY PO) Take 1 capsule by mouth daily.      diclofenac sodium (VOLTAREN) 1 % topical gel Apply four g topically to affected area three times daily as needed (pain).      famotidine (PEPCID) 20 mg tablet Take one tablet by mouth twice daily.      furosemide (LASIX) 20 mg tablet Take one tablet by mouth every morning.      loratadine (CLARITIN) 5 mg/5 mL oral solution Take 5 mL by mouth daily.      losartan (COZAAR) 25 mg tablet Take one tablet by mouth daily.      Magnesium Glycinate 100 mg tab Take two tablets by mouth daily.      metFORMIN (GLUCOPHAGE) 500 mg tablet Take one tablet by mouth twice daily after meals.      mineral oil/petrolatum,white (LACRI-LUBE S.O.P. OP) Place 1 drop into or around eye(s) daily as needed. (Patient not taking: Reported on 02/02/2024)      Multivitamins with Fluoride (MULTI-VITAMIN PO) Take 1 tablet by mouth daily.      pantoprazole DR (PROTONIX) 40 mg tablet Take  one tablet by mouth daily. (Patient not taking: Reported on 02/02/2024)  3    potassium chloride (K-DUR 10) 10 mEq tablet Take one tablet by mouth daily with food.      povidone (PF) (IVIZIA (PF)) 0.5 % drop Place  into or around eye(s).      sennosides-docusate sodium (SENNA PLUS) 8.6/50 mg tablet Take one tablet by mouth daily. Use with narcotic. Hold for loose stools 30 tablet 0    sertraline (ZOLOFT) 50 mg tablet Take one tablet by mouth daily.  3    White Petrolatum oint Apply  topically to affected area.         Allergies:  Ciprofloxacin and Penicillins    Social History:  Social History     Substance and Sexual Activity   Alcohol Use Yes    Alcohol/week: 1.0 standard drink of alcohol    Types: 1 Shots of liquor per week    Comment: Drink bourbon occasionally      Tobacco Use History[1]   Social History     Substance and Sexual Activity   Drug Use Never       Family History:  Family History   Problem Relation Name Age of Onset    Cancer Other      Diabetes Other          45    Heart Disease Other      Stroke Other      Diabetes Mother Geraghty         55       Physical Exam:  Vitals:    08/16/24 1426   BP: (!) 156/79   Pulse: 64   Temp: 36.6 ?C (97.9 ?F)   SpO2: 98%       Gen: well-developed, NAD  HEENT: normocephalic, conjuctiva clear, mucous membranes moist  Respiratory: normal respiratory effort with symmetric chest expansion  Skin: Warm, No jaundice  Musculoskeletal: No edema, no gross deformity  Neuro: Awake, alert, no focal deficits, uses cane  Psychiatric: Normal affect    IPG sit flat, non tender    Diagnostics:    Labs:   Lab Results   Component Value Date    CR 1.45 (H) 07/16/2018 Lab Results   Component Value Date    CULTURE >100,000 organisms/ml  ESCHERICHIA COLI   (A) 07/06/2018       Assessment & Plan  urinary incontinence  - met with garrison from [employer] [facility] today and re-programmed            No diagnosis found.    Return if symptoms worsen or fail to improve.        Augustin KANDICE Brasher, MD               [1]   Social History  Tobacco Use   Smoking Status Never   Smokeless Tobacco Never

## 2024-10-16 ENCOUNTER — Encounter: Admit: 2024-10-16 | Discharge: 2024-10-16 | Payer: MEDICARE

## 2024-10-17 ENCOUNTER — Encounter: Admit: 2024-10-17 | Discharge: 2024-10-17 | Payer: MEDICARE

## 2024-10-18 ENCOUNTER — Encounter: Admit: 2024-10-18 | Discharge: 2024-10-18 | Payer: MEDICARE
